# Patient Record
Sex: Female | Born: 1985 | Race: White | Hispanic: No | Marital: Single | Smoking: Current every day smoker
Health system: Southern US, Community
[De-identification: ages and names within clinical notes are randomized; demographics above are authoritative.]

## PROBLEM LIST (undated history)

## (undated) DIAGNOSIS — F32A Depression, unspecified: Secondary | ICD-10-CM

## (undated) DIAGNOSIS — K219 Gastro-esophageal reflux disease without esophagitis: Secondary | ICD-10-CM

## (undated) DIAGNOSIS — B977 Papillomavirus as the cause of diseases classified elsewhere: Secondary | ICD-10-CM

## (undated) DIAGNOSIS — R519 Headache, unspecified: Secondary | ICD-10-CM

## (undated) DIAGNOSIS — F329 Major depressive disorder, single episode, unspecified: Secondary | ICD-10-CM

## (undated) DIAGNOSIS — T7840XA Allergy, unspecified, initial encounter: Secondary | ICD-10-CM

## (undated) DIAGNOSIS — L732 Hidradenitis suppurativa: Secondary | ICD-10-CM

## (undated) DIAGNOSIS — G43909 Migraine, unspecified, not intractable, without status migrainosus: Secondary | ICD-10-CM

## (undated) DIAGNOSIS — R51 Headache: Secondary | ICD-10-CM

## (undated) HISTORY — DX: Headache: R51

## (undated) HISTORY — DX: Hidradenitis suppurativa: L73.2

## (undated) HISTORY — PX: COLPOSCOPY: SHX161

## (undated) HISTORY — DX: Major depressive disorder, single episode, unspecified: F32.9

## (undated) HISTORY — DX: Allergy, unspecified, initial encounter: T78.40XA

## (undated) HISTORY — DX: Depression, unspecified: F32.A

## (undated) HISTORY — DX: Migraine, unspecified, not intractable, without status migrainosus: G43.909

## (undated) HISTORY — DX: Gastro-esophageal reflux disease without esophagitis: K21.9

## (undated) HISTORY — DX: Headache, unspecified: R51.9

## (undated) HISTORY — DX: Papillomavirus as the cause of diseases classified elsewhere: B97.7

---

## 2005-03-22 ENCOUNTER — Other Ambulatory Visit: Admission: RE | Admit: 2005-03-22 | Discharge: 2005-03-22 | Payer: Self-pay | Admitting: Obstetrics and Gynecology

## 2005-10-12 ENCOUNTER — Inpatient Hospital Stay (HOSPITAL_COMMUNITY): Admission: AD | Admit: 2005-10-12 | Discharge: 2005-10-13 | Payer: Self-pay | Admitting: Obstetrics and Gynecology

## 2005-10-28 ENCOUNTER — Inpatient Hospital Stay (HOSPITAL_COMMUNITY): Admission: AD | Admit: 2005-10-28 | Discharge: 2005-10-28 | Payer: Self-pay | Admitting: Obstetrics and Gynecology

## 2005-10-29 ENCOUNTER — Inpatient Hospital Stay (HOSPITAL_COMMUNITY): Admission: AD | Admit: 2005-10-29 | Discharge: 2005-10-31 | Payer: Self-pay | Admitting: Obstetrics and Gynecology

## 2006-03-20 HISTORY — PX: CHOLECYSTECTOMY: SHX55

## 2006-10-06 ENCOUNTER — Emergency Department (HOSPITAL_COMMUNITY): Admission: EM | Admit: 2006-10-06 | Discharge: 2006-10-07 | Payer: Self-pay | Admitting: Emergency Medicine

## 2006-10-30 ENCOUNTER — Emergency Department (HOSPITAL_COMMUNITY): Admission: EM | Admit: 2006-10-30 | Discharge: 2006-10-30 | Payer: Self-pay | Admitting: Emergency Medicine

## 2006-11-05 ENCOUNTER — Ambulatory Visit (HOSPITAL_COMMUNITY): Admission: RE | Admit: 2006-11-05 | Discharge: 2006-11-06 | Payer: Self-pay | Admitting: General Surgery

## 2006-11-05 ENCOUNTER — Encounter (INDEPENDENT_AMBULATORY_CARE_PROVIDER_SITE_OTHER): Payer: Self-pay | Admitting: General Surgery

## 2008-06-14 ENCOUNTER — Emergency Department (HOSPITAL_COMMUNITY): Admission: EM | Admit: 2008-06-14 | Discharge: 2008-06-14 | Payer: Self-pay | Admitting: Emergency Medicine

## 2008-07-02 IMAGING — US US ABDOMEN COMPLETE
1 series · 14 of 25 positions shown · non-contrast
Comparison: none

CLINICAL DATA: 21-year-old female with abdominal  pain. 
ABDOMEN ULTRASOUND:
TECHNIQUE: Complete abdominal ultrasound examination was performed including evaluation of the liver, gallbladder, bile ducts, pancreas, kidneys, spleen, IVC, and abdominal aorta.

[Series 1: unknown · 0.40mm/px · 14 of 57 slices shown]
[im 1/57]
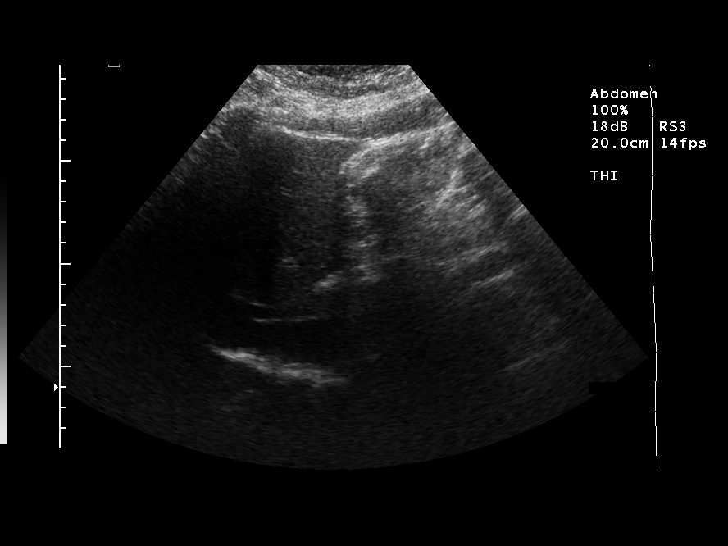
[im 5/57]
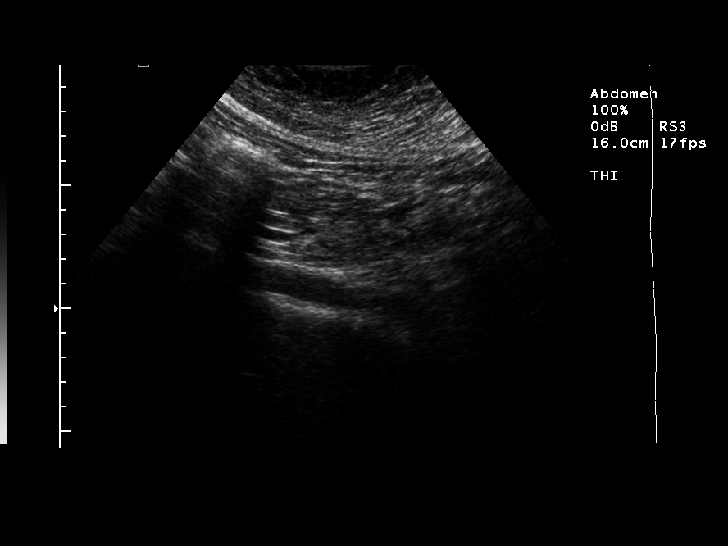
[im 10/57]
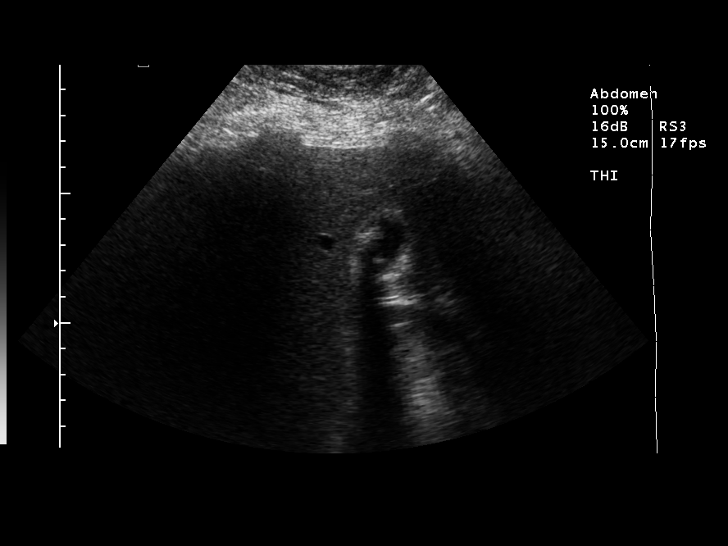
[im 15/57]
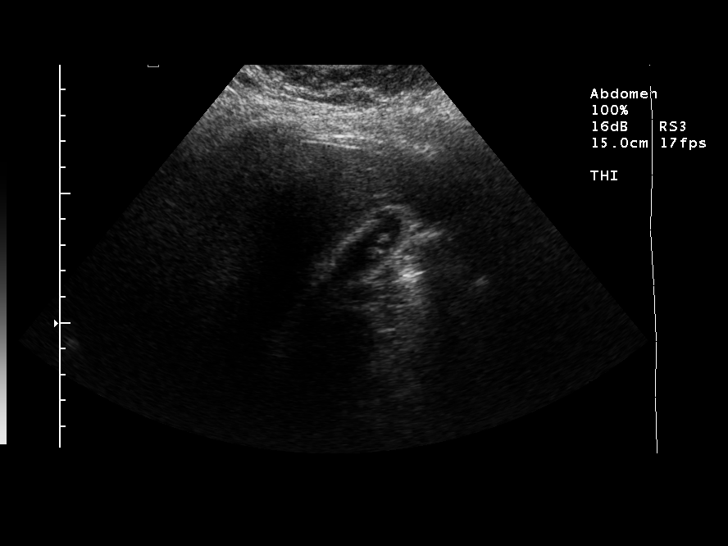
[im 19/57]
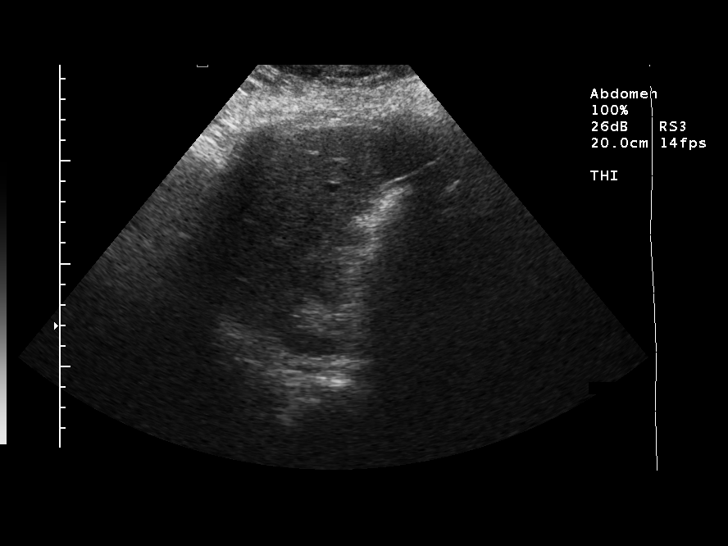
[im 22/57]
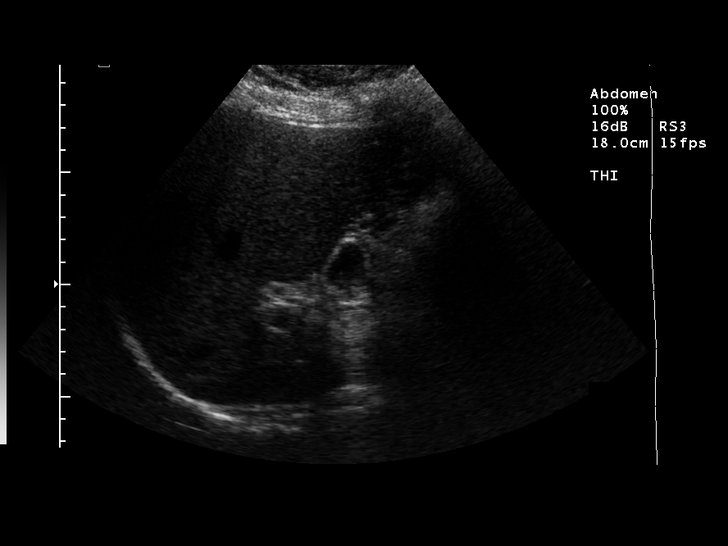
[im 26/57]
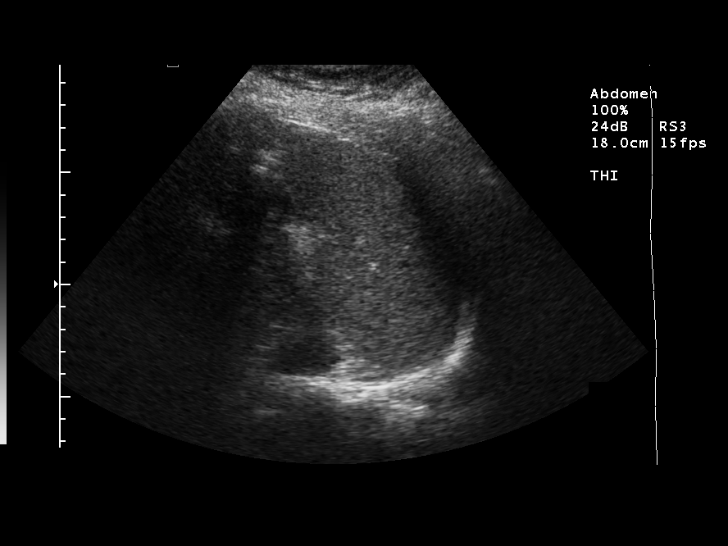
[im 31/57]
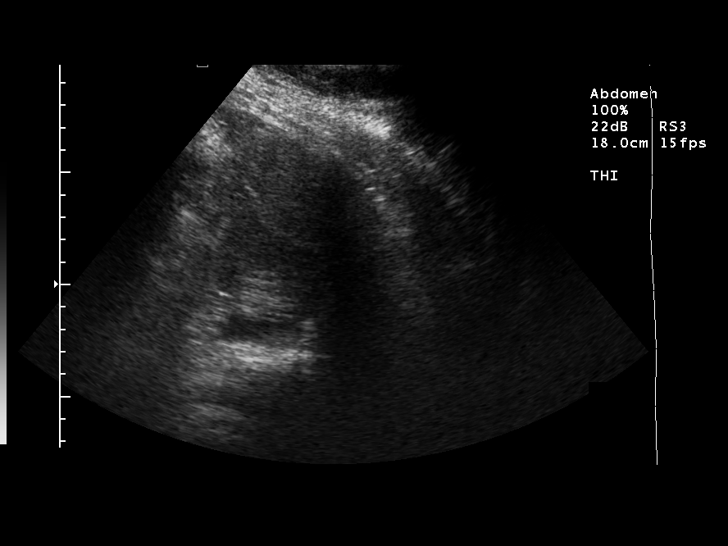
[im 36/57]
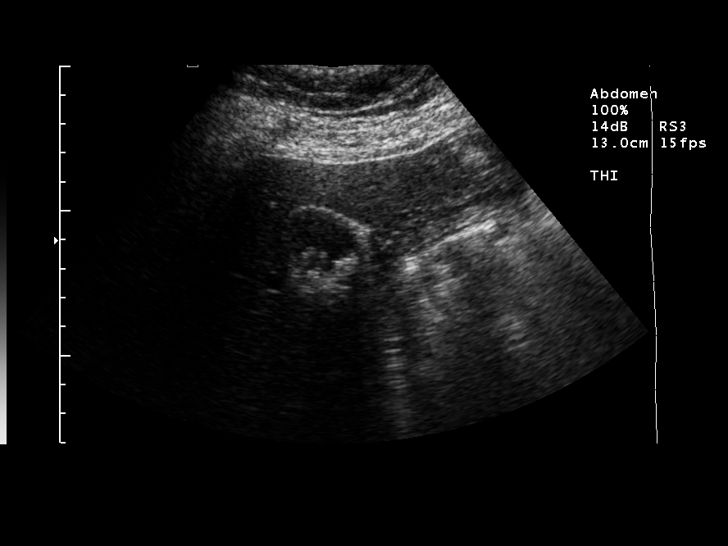
[im 38/57]
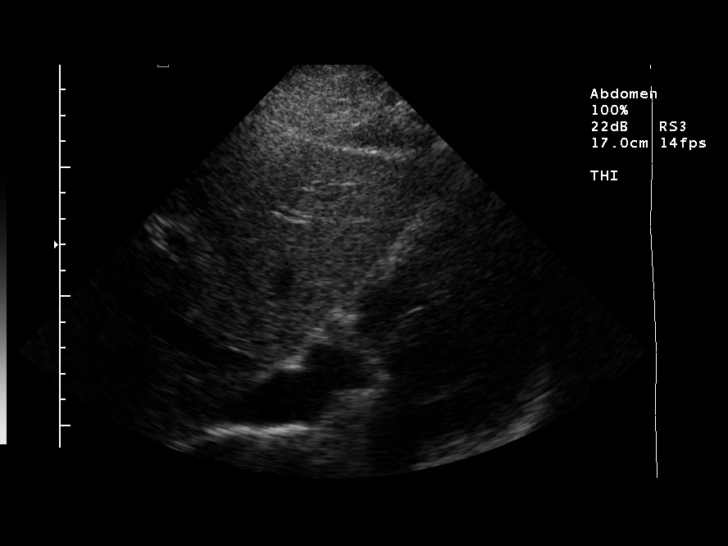
[im 43/57]
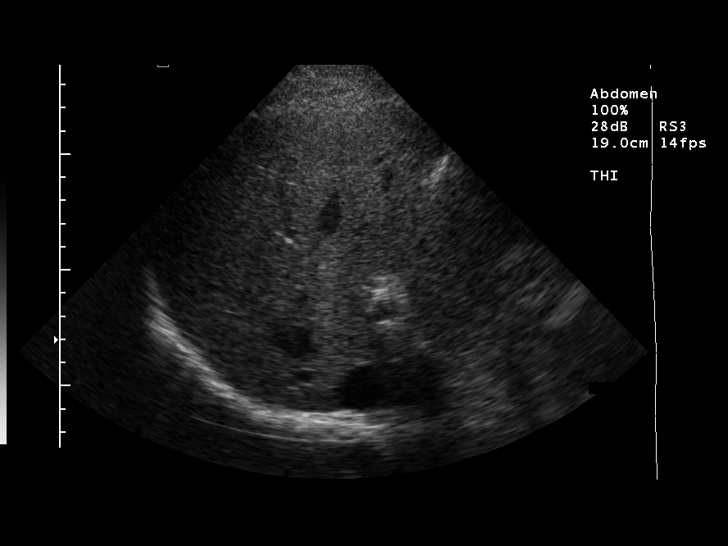
[im 47/57]
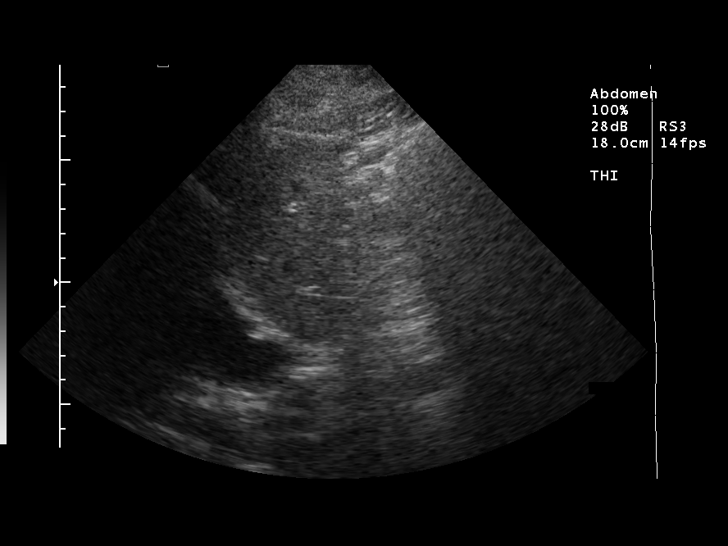
[im 52/57]
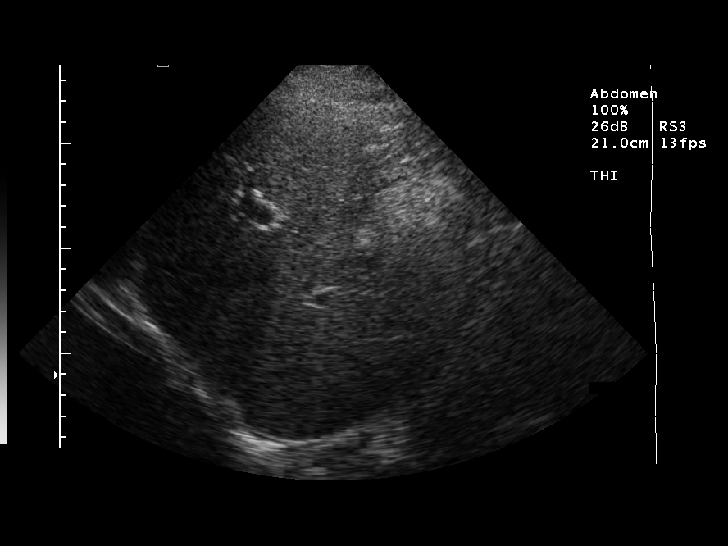
[im 57/57]
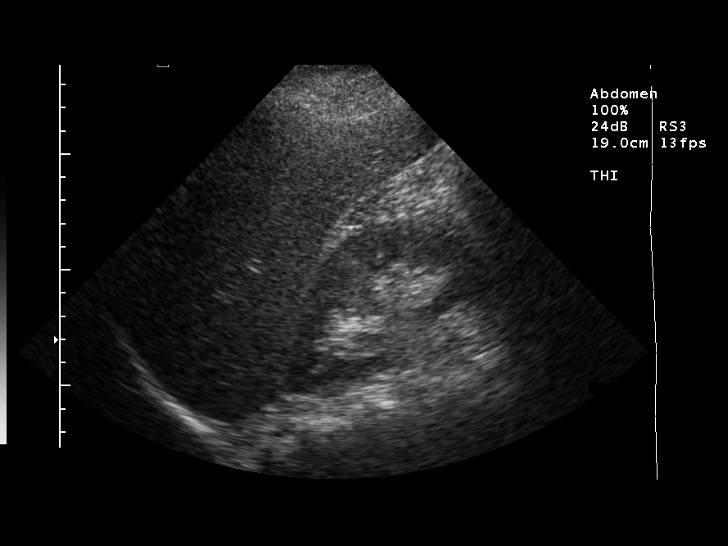

[14 of 25 positions shown; findings below may reference images not displayed]

FINDINGS: Cholelithiasis identified with upper limits gallbladder wall thickness - question cholecystitis. Diffuse fatty infiltration of the liver is identified. No evidence of free fluid. IVC, common bile duct, spleen, kidneys, and abdominal aorta are unremarkable. The pancreas is not well visualized secondary to overlying bowel gas.
IMPRESSION: 1.  Cholelithiasis with upper limits of normal gallbladder wall thickness. Cholecystitis not excluded. No biliary dilatation. 
2.  Fatty liver.
3.  Pancreas not well visualized.

## 2008-07-31 IMAGING — RF DG CHOLANGIOGRAM OPERATIVE
1 series · 4 of 4 positions shown · non-contrast
Comparison: none

CLINICAL DATA: History of gallstones.  
 OPERATIVE CHOLANGIOGRAM ? 11/05/06:
TECHNIQUE: Multiple fluoroscopic radiographs were obtained during intraoperative cholangiogram and are submitted for interpretation post-operatively.

[Series 1: run · 4 of 63 frames shown]
[frame 2/63]
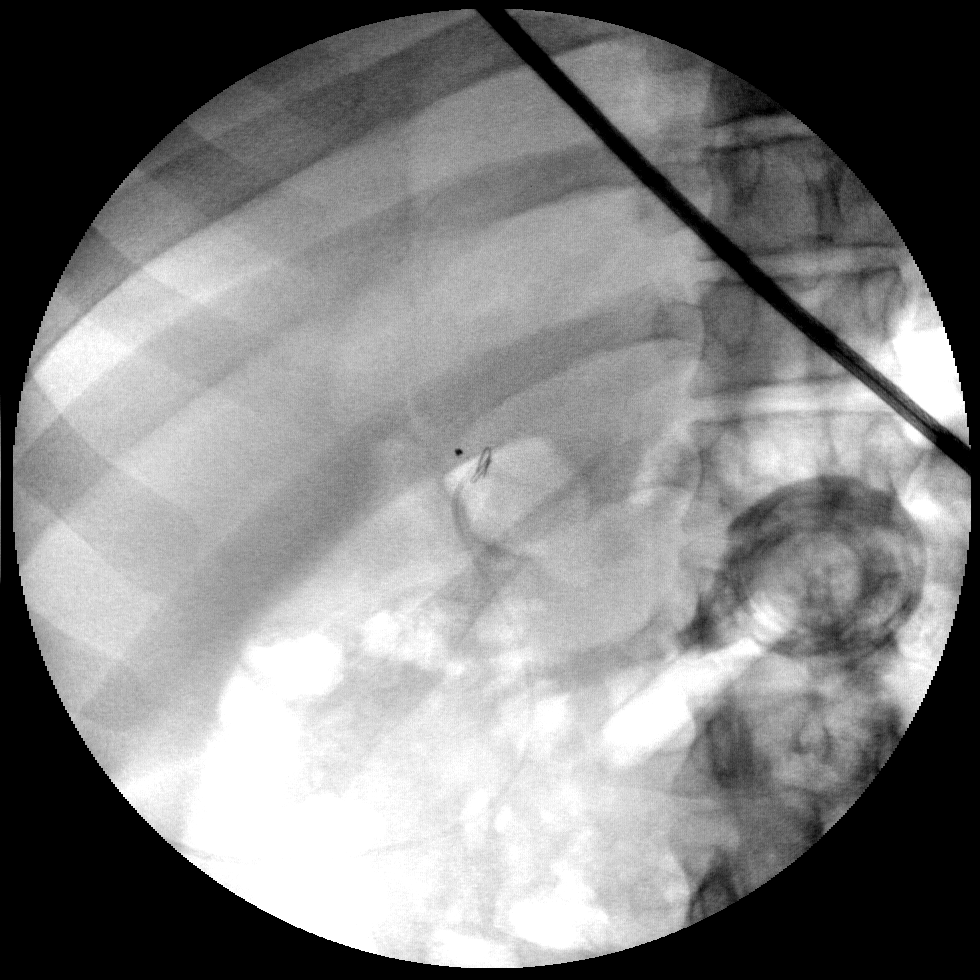
[frame 10/63]
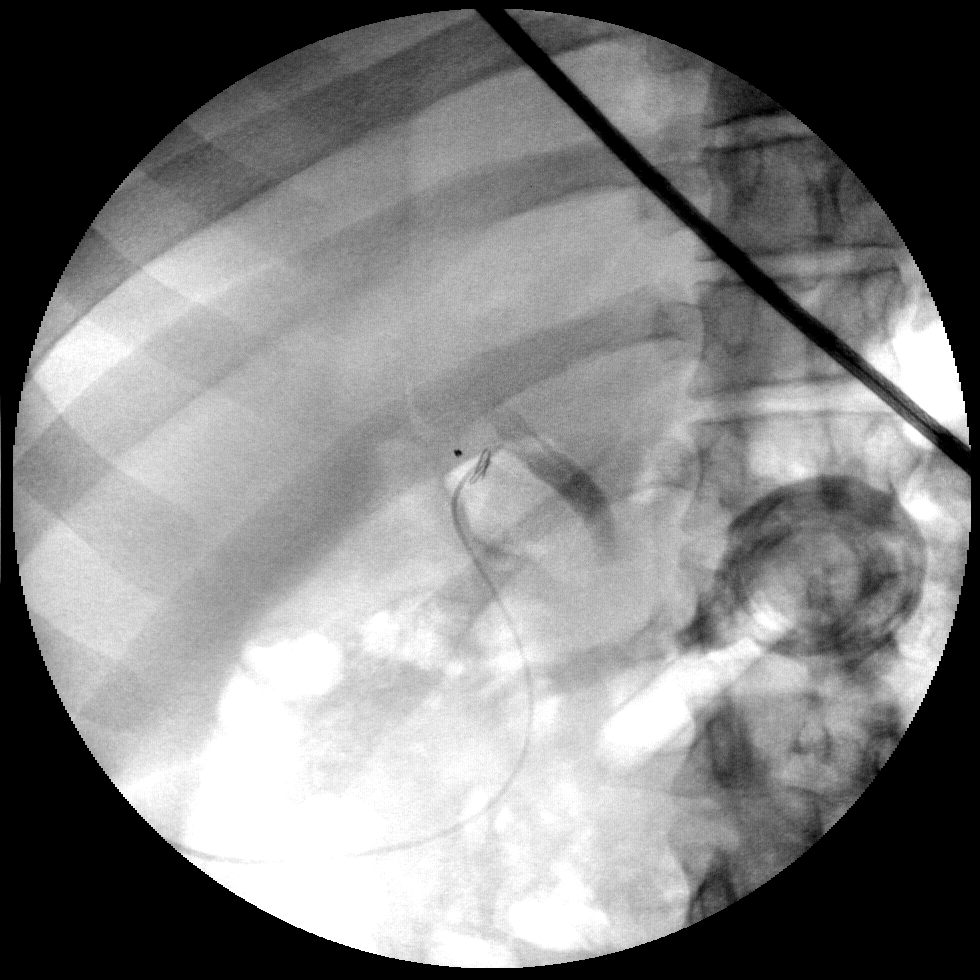
[frame 32/63]
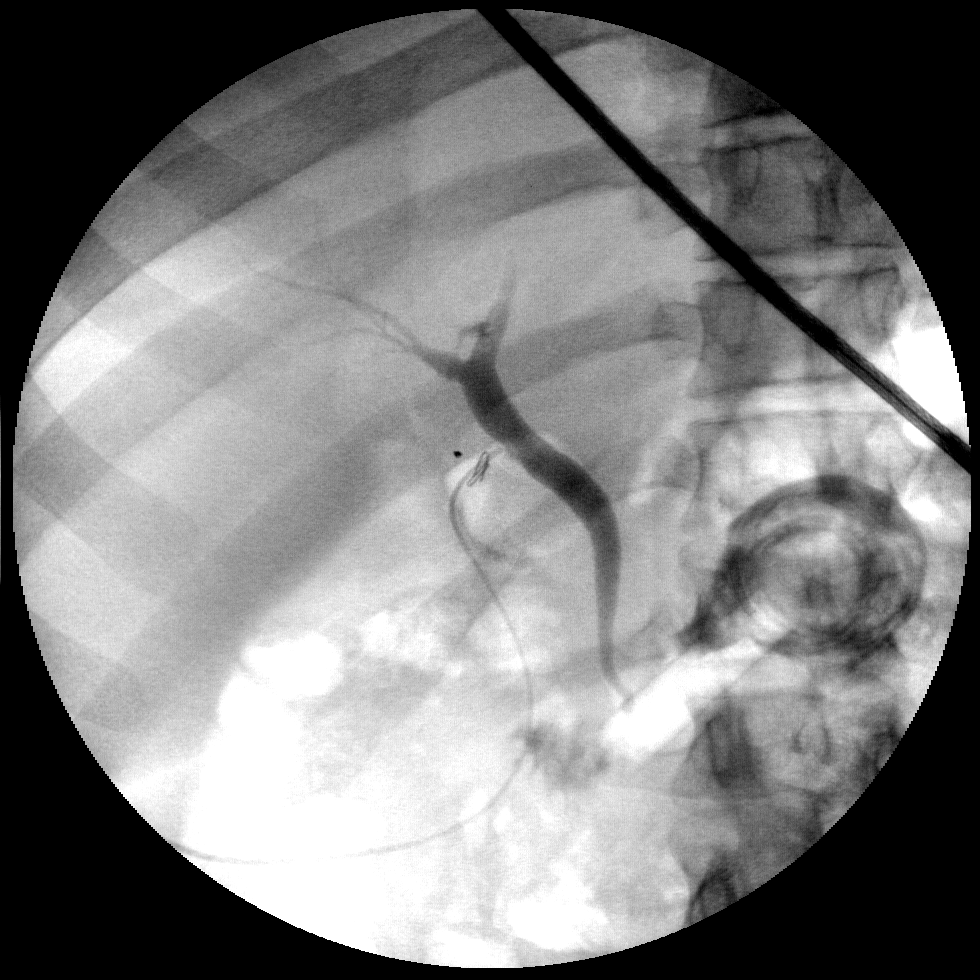
[frame 54/63]
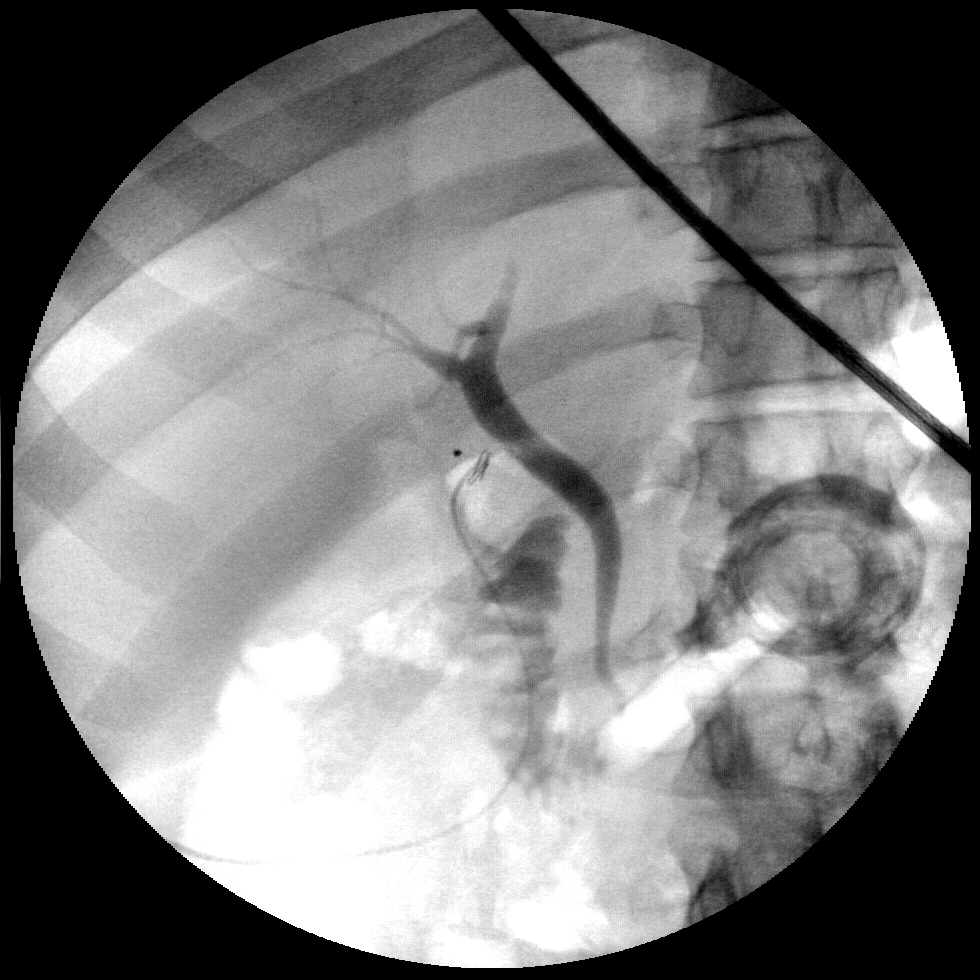

[4 of 4 positions shown; findings below may reference images not displayed]

FINDINGS: The common bile duct is well visualized without obstruction of the common bile duct.  No definite retained stones.
IMPRESSION: No obstruction to the common bile duct with contrast noted in the duodenum.

## 2010-06-30 LAB — URINE MICROSCOPIC-ADD ON

## 2010-06-30 LAB — URINALYSIS, ROUTINE W REFLEX MICROSCOPIC
Bilirubin Urine: NEGATIVE
Glucose, UA: NEGATIVE mg/dL
Ketones, ur: NEGATIVE mg/dL
Nitrite: POSITIVE — AB
Protein, ur: 30 mg/dL — AB
Specific Gravity, Urine: 1.024 (ref 1.005–1.030)
Urobilinogen, UA: 1 mg/dL (ref 0.0–1.0)
pH: 6.5 (ref 5.0–8.0)

## 2010-06-30 LAB — POCT PREGNANCY, URINE: Preg Test, Ur: NEGATIVE

## 2010-07-04 ENCOUNTER — Other Ambulatory Visit: Payer: Self-pay | Admitting: Obstetrics and Gynecology

## 2010-08-02 NOTE — Op Note (Signed)
Ann Obrien              ACCOUNT NO.:  [id number]   MEDICAL RECORD NO.:  [id number]          PATIENT TYPE:  OIB   LOCATION:  1539                         FACILITY:  [facility]   PHYSICIAN:  Anselm Pancoast. Weatherly, M.D.DATE OF BIRTH:  Oct 25, 1985   DATE OF PROCEDURE:  11/05/2006  DATE OF DISCHARGE:                               OPERATIVE REPORT   PREOPERATIVE DIAGNOSIS:  Chronic cholecystitis with stones.   POSTOPERATIVE DIAGNOSIS:  Chronic cholecystitis with stones.   OPERATIONS:  Laparoscopic cholecystectomy with cholangiogram.   ANESTHESIA:  General anesthesia.   SURGEON:  Anselm Pancoast. Zachery Dakins, M.D.   ASSISTANT:  Nurse.   HISTORY:  Ann Obrien is a 25 year old Caucasian female who is  approximately a year postpartum.  She had been seen in the emergency  room approximately 2 weeks ago.  She has actually had four episodes of  pain which she describes all similar pain that usually occurs after  eating. She had a couple episodes that were severe enough that she went  to the emergency room and she actually went back to the emergency room  approximately a week ago with a fifth episode.  She has had mildly  elevated liver function studies, SGOT and SGPT in approximately the 250  range and it showed a mildly thickened gallbladder but no definite  dilated common bile ducts. She weighs approximately 220 and is 5 feet 4  and is 1 year postpartum.  The patient preoperatively was given some  sedation this morning. Her liver function studies on Friday were normal  as was her white blood count.  She received 3 grams of Unasyn, she got  PAS stockings and was taken to the operative suite.  The abdomen was  prepped with Betadine surgical scrub.  After induction of general  anesthesia, an oral tube placed into the stomach.  A small incision was  made below the umbilicus, the fascia was identified about 2 inches down,  picked up between two Kochers and then a small opening made, I  thought,  into the peritoneal cavity.  I put a pursestring suture of #0 Vicryl and  then the Hasson cannula, but we were still kind of in the preperitoneal  space.  I grasped the fascia again and with a Tresa Endo could enter into the  peritoneal cavity and then reinserted this cannula. This time you could  see a little opening which we went through and then the upper 10 mm  trocar was placed in the subxiphoid area and the two lateral 5 mm  trocars were placed at the appropriate lateral position.  The  gallbladder was distended but not acutely inflamed and there was no  actual adhesions around it.  The proximal portion of the gallbladder was  carefully opened up. The cystic duct was freed, I could see also the  cystic artery and then with the peritoneal open pretty proximal on the  gallbladder, I could see the little Monday.  I placed a clip on the  junction of the cystic duct gallbladder and then made a little opening  proximal to this. A Cook catheter was introduced and  held in place with  a clip. The cholangiogram was obtained and it showed good prompt filling  of the extrahepatic biliary system, good flow into the duodenum and no  evidence of any common duct stones.  The catheter was removed, three  clips were placed on the cystic duct.  I could then identify the cystic  artery nicely and two clips proximally, one distally was placed and then  it was divided. The gallbladder was freed up using the hook  electrocautery.  I could see a little vessel I think is the  posterior  branch of the cystic artery and this was clipped and then divided. The  gallbladder was freed from its bed, the most distal portion was  intrahepatic and the gallbladder was free.  The irrigation was used, the  bed had good hemostasis and then the gallbladder was placed in an  EndoCatch bag.  The irrigation fluid was aspirated, we reinspected the  bed and everything looked satisfactory. We then slipped the camera   through the upper 10-mm trocar, withdrew the bag containing the  gallbladder at the umbilical cord. I then put a figure-of-eight suture  in the fascia at the umbilicus in addition to the pursestring suture  that had been previously placed.  Both were tied and then all total  about 30 mL of Marcaine with adrenaline was used.  The patient's CO2 was  released, there was no bleeding from the exit ports and then the  incisions were closed with 4-0 Vicryl.  Benzoin and Steri-Strips on the  skin. The patient tolerated the procedure nicely and was sent to the  recovery room in satisfactory condition.  She was complaining of nausea  when she first went to the recovery room.           ______________________________  Anselm Pancoast. Zachery Dakins, M.D.     WJW/MEDQ  D:  11/05/2006  T:  11/05/2006  Job:  [id number]

## 2010-12-30 LAB — AMYLASE: Amylase: 34

## 2010-12-30 LAB — PREGNANCY, URINE: Preg Test, Ur: NEGATIVE

## 2011-01-02 LAB — DIFFERENTIAL
Basophils Absolute: 0.1
Basophils Absolute: 0
Basophils Relative: 0
Eosinophils Absolute: 0.1
Eosinophils Relative: 1
Lymphocytes Relative: 26
Lymphocytes Relative: 18
Lymphs Abs: 1.9
Monocytes Absolute: 0.4
Monocytes Absolute: 0.6
Monocytes Relative: 6
Neutro Abs: 6.4
Neutro Abs: 7.7
Neutrophils Relative %: 74

## 2011-01-02 LAB — CBC
HCT: 43
Hemoglobin: 14.6
Hemoglobin: 14.6
MCHC: 34
MCV: 86.6
Platelets: 272
RBC: 4.97
RDW: 12.1
RDW: 12.3
WBC: 9.6
WBC: 10.4

## 2011-01-02 LAB — URINE MICROSCOPIC-ADD ON

## 2011-01-02 LAB — COMPREHENSIVE METABOLIC PANEL
ALT: 121 — ABNORMAL HIGH
AST: 211 — ABNORMAL HIGH
Albumin: 3.5
Albumin: 3.5
Alkaline Phosphatase: 103
BUN: 13
BUN: 18
CO2: 29
Calcium: 9.5
Calcium: 10.1
Chloride: 105
Creatinine, Ser: 0.82
Creatinine, Ser: 0.88
GFR calc Af Amer: >60
GFR calc non Af Amer: >60
Glucose, Bld: 137 — ABNORMAL HIGH
Potassium: 3.8
Sodium: 140
Total Bilirubin: 0.8
Total Protein: 6.7
Total Protein: 6.6

## 2011-01-02 LAB — URINALYSIS, ROUTINE W REFLEX MICROSCOPIC
Glucose, UA: NEGATIVE
Hgb urine dipstick: NEGATIVE
Nitrite: NEGATIVE
Specific Gravity, Urine: 1.02
Urobilinogen, UA: 0.2
pH: 6.5

## 2011-01-02 LAB — BASIC METABOLIC PANEL
Calcium: 9.4
GFR calc Af Amer: >60
GFR calc non Af Amer: >60
Glucose, Bld: 133 — ABNORMAL HIGH
Sodium: 138

## 2011-01-02 LAB — POCT PREGNANCY, URINE
Operator id: 133351
Preg Test, Ur: NEGATIVE

## 2011-01-02 LAB — PREGNANCY, URINE: Preg Test, Ur: NEGATIVE

## 2011-01-02 LAB — B-NATRIURETIC PEPTIDE (CONVERTED LAB): Pro B Natriuretic peptide (BNP): <30

## 2011-01-02 LAB — LIPASE, BLOOD: Lipase: 19

## 2015-01-05 ENCOUNTER — Other Ambulatory Visit (HOSPITAL_COMMUNITY)
Admission: RE | Admit: 2015-01-05 | Discharge: 2015-01-05 | Disposition: A | Discharge status: Home / Self Care | Payer: BLUE CROSS/BLUE SHIELD | Source: Ambulatory Visit | Attending: Unknown Physician Specialty | Admitting: Unknown Physician Specialty

## 2015-01-05 DIAGNOSIS — R87619 Unspecified abnormal cytological findings in specimens from cervix uteri: Secondary | ICD-10-CM | POA: Diagnosis not present

## 2015-01-07 LAB — CYTOLOGY - PAP

## 2015-01-11 ENCOUNTER — Encounter: Payer: Self-pay | Admitting: Family Medicine

## 2016-05-02 ENCOUNTER — Other Ambulatory Visit (HOSPITAL_COMMUNITY)
Admission: RE | Admit: 2016-05-02 | Discharge: 2016-05-02 | Disposition: A | Discharge status: Home / Self Care | Payer: BLUE CROSS/BLUE SHIELD | Source: Ambulatory Visit | Attending: Unknown Physician Specialty | Admitting: Unknown Physician Specialty

## 2016-05-02 DIAGNOSIS — R87810 Cervical high risk human papillomavirus (HPV) DNA test positive: Secondary | ICD-10-CM | POA: Insufficient documentation

## 2016-12-22 LAB — CBC AND DIFFERENTIAL
HEMATOCRIT: 44 (ref 36–46)
HEMOGLOBIN: 14.7 (ref 12.0–16.0)
WBC: 9.6

## 2016-12-22 LAB — HEPATIC FUNCTION PANEL
ALT: 12 (ref 7–35)
AST: 17 (ref 13–35)

## 2016-12-22 LAB — BASIC METABOLIC PANEL
Potassium: 4 (ref 3.4–5.3)
Sodium: 139 (ref 137–147)

## 2017-03-21 LAB — HM PAP SMEAR: HM Pap smear: NEGATIVE

## 2017-05-18 ENCOUNTER — Ambulatory Visit: Payer: BLUE CROSS/BLUE SHIELD | Admitting: Family Medicine

## 2017-05-25 ENCOUNTER — Encounter: Payer: Self-pay | Admitting: Family Medicine

## 2017-05-25 ENCOUNTER — Other Ambulatory Visit: Payer: Self-pay

## 2017-05-25 ENCOUNTER — Ambulatory Visit (INDEPENDENT_AMBULATORY_CARE_PROVIDER_SITE_OTHER): Payer: BLUE CROSS/BLUE SHIELD | Admitting: Family Medicine

## 2017-05-25 VITALS — BP 124/88 | HR 86 | Temp 98.9°F | Ht 63.0 in | Wt 236.0 lb

## 2017-05-25 DIAGNOSIS — Z3040 Encounter for surveillance of contraceptives, unspecified: Secondary | ICD-10-CM | POA: Insufficient documentation

## 2017-05-25 DIAGNOSIS — L732 Hidradenitis suppurativa: Secondary | ICD-10-CM | POA: Insufficient documentation

## 2017-05-25 DIAGNOSIS — R197 Diarrhea, unspecified: Secondary | ICD-10-CM | POA: Diagnosis not present

## 2017-05-25 DIAGNOSIS — G43009 Migraine without aura, not intractable, without status migrainosus: Secondary | ICD-10-CM | POA: Diagnosis not present

## 2017-05-25 DIAGNOSIS — J301 Allergic rhinitis due to pollen: Secondary | ICD-10-CM | POA: Insufficient documentation

## 2017-05-25 DIAGNOSIS — F17210 Nicotine dependence, cigarettes, uncomplicated: Secondary | ICD-10-CM | POA: Diagnosis not present

## 2017-05-25 LAB — TSH: TSH: 4.05 u[IU]/mL (ref 0.35–4.50)

## 2017-05-25 LAB — CBC WITH DIFFERENTIAL/PLATELET
Basophils Absolute: 0 10*3/uL (ref 0.0–0.1)
Basophils Relative: 0.5 % (ref 0.0–3.0)
EOS PCT: 2.8 % (ref 0.0–5.0)
Eosinophils Absolute: 0.3 10*3/uL (ref 0.0–0.7)
HEMATOCRIT: 43.8 % (ref 36.0–46.0)
Hemoglobin: 14.8 g/dL (ref 12.0–15.0)
LYMPHS PCT: 30.2 % (ref 12.0–46.0)
Lymphs Abs: 2.8 10*3/uL (ref 0.7–4.0)
MCHC: 33.8 g/dL (ref 30.0–36.0)
MCV: 91.7 fl (ref 78.0–100.0)
MONOS PCT: 4.7 % (ref 3.0–12.0)
Monocytes Absolute: 0.4 10*3/uL (ref 0.1–1.0)
Neutro Abs: 5.8 10*3/uL (ref 1.4–7.7)
Neutrophils Relative %: 61.8 % (ref 43.0–77.0)
Platelets: 286 10*3/uL (ref 150.0–400.0)
RBC: 4.78 Mil/uL (ref 3.87–5.11)
RDW: 12.8 % (ref 11.5–15.5)
WBC: 9.4 10*3/uL (ref 4.0–10.5)

## 2017-05-25 LAB — COMPREHENSIVE METABOLIC PANEL
ALBUMIN: 4.1 g/dL (ref 3.5–5.2)
ALK PHOS: 87 U/L (ref 39–117)
ALT: 19 U/L (ref 0–35)
AST: 11 U/L (ref 0–37)
BILIRUBIN TOTAL: 0.4 mg/dL (ref 0.2–1.2)
BUN: 16 mg/dL (ref 6–23)
CO2: 25 mEq/L (ref 19–32)
Calcium: 9.6 mg/dL (ref 8.4–10.5)
Chloride: 107 mEq/L (ref 96–112)
Creatinine, Ser: 0.93 mg/dL (ref 0.40–1.20)
GFR: 73.82 mL/min (ref >60.00)
Glucose, Bld: 98 mg/dL (ref 70–99)
Potassium: 4 mEq/L (ref 3.5–5.1)
Sodium: 141 mEq/L (ref 135–145)
TOTAL PROTEIN: 7 g/dL (ref 6.0–8.3)

## 2017-05-25 LAB — SEDIMENTATION RATE: SED RATE: 6 mm/h (ref 0–20)

## 2017-05-25 NOTE — Patient Instructions (Signed)
Please return in 2 weeks to go over results, recheck.  It was a pleasure meeting you today! Thank you for choosing [place] to meet your healthcare needs! I truly look forward to working with you. If you have any questions or concerns, please send me a message via Mychart or call the office at [phone].   Calcium Intake Recommendations You can take Caltrate Plus twice a day or get it through your diet or other OTC supplements (Viactiv, OsCal etc)  Calcium is a mineral that affects many functions in the body, including:  Blood clotting.  Blood vessel function.  Nerve impulse conduction.  Hormone secretion.  Muscle contraction.  Bone and teeth functions.  Most of your body's calcium supply is stored in your bones and teeth. When your calcium stores are low, you may be at risk for low bone mass, bone loss, and bone fractures. Consuming enough calcium helps to grow healthy bones and teeth and to prevent breakdown over time. It is very important that you get enough calcium if you are:  A child undergoing rapid growth.  An adolescent girl.  A pre- or post-menopausal woman.  A woman whose menstrual cycle has stopped due to anorexia nervosa or regular intense exercise.  An individual with lactose intolerance or a milk allergy.  A vegetarian.  What is my plan? Try to consume the recommended amount of calcium daily based on your age. Depending on your overall health, your health care provider may recommend increased calcium intake.General daily calcium intake recommendations by age are:  Birth to 6 months: 200 mg.  Infants 7 to 12 months: 260 mg.  Children 1 to 3 years: 700 mg.  Children 4 to 8 years: 1,000 mg.  Children 9 to 13 years: 1,300 mg.  Teens 14 to 18 years: 1,300 mg.  Adults 19 to 50 years: 1,000 mg.  Adult women 51 to 70 years: 1,200 mg.  Adult men 51 to 70 years: 1,000 mg.  Adults 71 years and older: 1,200 mg.  Pregnant and breastfeeding teens: 1,300  mg.  Pregnant and breastfeeding adults: 1,000 mg.  What do I need to know about calcium intake?  In order for the body to absorb calcium, it needs vitamin D. You can get vitamin D through (we recommend getting [phone] units of Vitamin D daily) ? Direct exposure of the skin to sunlight. ? Foods, such as egg yolks, liver, saltwater fish, and fortified milk. ? Supplements.  Consuming too much calcium may cause: ? Constipation. ? Decreased absorption of iron and zinc. ? Kidney stones.  Calcium supplements may interact with certain medicines. Check with your health care provider before starting any calcium supplements.  Try to get most of your calcium from food. What foods can I eat? Grains  Fortified oatmeal. Fortified ready-to-eat cereals. Fortified frozen waffles. Vegetables Turnip greens. Broccoli. Fruits Fortified orange juice. Meats and Other Protein Sources Canned sardines with bones. Canned salmon with bones. Soy beans. Tofu. Baked beans. Almonds. [place] nuts. Sunflower seeds. Dairy Milk. Yogurt. Cheese. Cottage cheese. Beverages Fortified soy milk. Fortified rice milk. Sweets/Desserts Pudding. Ice Cream. Milkshakes. Blackstrap molasses. The items listed above may not be a complete list of recommended foods or beverages. Contact your dietitian for more options. What foods can affect my calcium intake? It may be more difficult for your body to use calcium or calcium may leave your body more quickly if you consume large amounts of:  Sodium.  Protein.  Caffeine.  Alcohol.  This information is not intended  to replace advice given to you by your health care provider. Make sure you discuss any questions you have with your health care provider. Document Released: 10/19/2003 Document Revised: 09/24/2015 Document Reviewed: 08/12/2013 Elsevier Interactive Patient Education  2018 [employer].  Lactose Intolerance, Adult Lactose is the natural sugar found in milk and  milk products, such as cheese and yogurt. Lactose is digested by lactase, an enzyme in your small intestine. Some people do not produce enough lactase to digest lactose. This is called lactose intolerance. Lactose intolerance is different from milk allergy, which is a more serious reaction to the protein in milk. What are the causes? Causes of lactose intolerance may include:  Normal aging. The ability to produce lactase may decline with age, causing lactose intolerance over time.  Being born without the ability to make lactase.  Digestive diseases such as gastroenteritis or inflammatory bowel disease.  Surgery or injuries to your small intestine.  Infection in your intestines.  Certain antibiotic medicines and cancer treatments.  What are the signs or symptoms? Lactose intolerance can cause uncomfortable symptoms. These are likely to occur within 30 minutes to 2 hours after eating or drinking foods containing lactose. Symptoms of lactose intolerance may include:  Nausea.  Diarrhea.  Abdominal cramps or pain.  Bloating.  Gas.  How is this diagnosed? There are several tests your health care provider can do to diagnose lactose intolerance. These tests include a hydrogen breath test and stool acidity test. How is this treated? No treatment can improve your body's ability to produce lactase. However, your symptoms can be controlled by limiting or avoiding milk products and other sources of lactose and adjusting your diet. Lactose-free milk is often tolerated. Lactose digestion may also be improved by adding lactase drops to regular milk or by taking lactase tablets when dairy products are consumed. Tolerance to lactose is individual. Some people may be able to eat or drink small amounts of products with lactose, while other may need to avoid lactose entirely. Talk to your health care provider about what is best for you. Follow these instructions at home:  Limit or avoidfoods,  beverages, and medicines containing lactose as directed by your health care provider.  Read food and medicine labels carefully to avoid products containing lactose, milk solids, casein, or whey.  If you eliminate dairy products, replace the protein, calcium, vitamin D, and other nutrients they contain through other foods. A registered dietitian or your health care provider can help you adjust your diet.  Choose a milk substitute that is fortified with calcium and vitamin D. Be aware that soy milk contains high quality protein, while milks made from nuts or grains contain very little protein.  Use lactase drops or tablets if directed by your health care provider. Contact a health care provider if: You have no relief from your symptoms after eliminating milk products and other sources of lactose. This information is not intended to replace advice given to you by your health care provider. Make sure you discuss any questions you have with your health care provider. Document Released: 03/06/2005 Document Revised: 08/12/2015 Document Reviewed: 06/06/2013 Elsevier Interactive Patient Education  2018 [employer].

## 2017-05-25 NOTE — Progress Notes (Signed)
Subjective  CC:  Chief Complaint  Patient presents with  . Establish Care  . Diarrhea    Patient states that since Thanksgiving, she has been sick with Nausea, Vomitting and Diarrhea, 1-2 times per week     HPI: Ann Obrien is a 32 y.o. female who presents to [place] [facility] today to establish care with me as a new patient.  She has the following concerns or needs:   32 year old female, mother of an 77 year old son, and monogamous relationship, presents as new patient.  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  He has not had primary care doctor before.  Complains of diarrhea: She reports that since the birth of her child she had frequent episodes of diarrhea after meals.  This tended to get better over time.  She had her gallbladder out 2 years ago.  After that she had regular bowel movements.  Then about 3-4 months ago, complained of intermittent but weekly episodes of nausea vomiting and diarrhea.  She reports multiple bouts of diarrhea, loose watery stools without mucus or blood.  She denies pain but did have abdominal cramping.  She believes that she could be lactose intolerant.  Over the last 3 weeks she has been eaten lactose and gluten-free.  She is no longer having diarrhea.  She has had mild constipation over the last 48 hours and this is uncomfortable for her.  Her mother has irritable bowel syndrome.  No recent travel out of the country.  No family history of inflammatory bowel disease.  She denies fevers, chills, abdominal pain, melena, unwanted weight loss.  She is on Depo-Provera for contraception.  We updated and reviewed the patient's past history in detail and it is documented below.  Patient Active Problem List   Diagnosis Date Noted  . Cigarette nicotine dependence 05/25/2017  . Chronic allergic rhinitis due to pollen 05/25/2017  . Migraine headache without aura 05/25/2017  . Contraceptive use - depo-provera 05/25/2017  .  Hidradenitis suppurativa 05/25/2017   Health Maintenance  Topic Date Due  . HIV Screening  06/17/1999  . INFLUENZA VACCINE  01/25/2018 (Originally 10/18/2016)  . PAP SMEAR  01/04/2018  . TETANUS/TDAP  06/18/2021   Immunization History  Administered Date(s) Administered  . Tdap 06/19/2011   Current Meds  Medication Sig  . Aspirin-Acetaminophen-Caffeine (GOODY HEADACHE PO) Take by mouth.  . fluticasone (FLONASE) 50 MCG/ACT nasal spray Place into both nostrils daily.  . Loratadine (CLARITIN PO) Take by mouth.    Allergies: Patient is allergic to zithromax [azithromycin]; oxycodone; and tylenol 8 hour [acetaminophen]. Past Medical History Patient  has a past medical history of Allergy, Depression, Frequent headaches, GERD (gastroesophageal reflux disease), and Migraines. Past Surgical History Patient  has a past surgical history that includes Cholecystectomy (2008). Family History: Patient family history includes Asthma in her son; Irritable bowel syndrome in her mother. Social History:  Patient  reports that she has been smoking cigarettes.  She has a 20.00 pack-year smoking history. she has never used smokeless tobacco. She reports that she drinks alcohol. She reports that she does not use drugs.  Review of Systems: Constitutional: negative for fever or malaise Ophthalmic: negative for photophobia, double vision or loss of vision Cardiovascular: negative for chest pain, dyspnea on exertion, or new LE swelling Respiratory: negative for SOB or persistent cough Gastrointestinal: negative for abdominal pain, positive for change in bowel habits or melena Genitourinary: negative for dysuria or gross hematuria Musculoskeletal: negative for new gait  disturbance or muscular weakness Integumentary: negative for new or persistent rashes Neurological: negative for TIA or stroke symptoms Psychiatric: negative for SI or delusions Allergic/Immunologic: negative for hives  Patient Care Team      Relationship Specialty Notifications Start End  Leamon Arnt, MD PCP - General Family Medicine  05/25/17     Objective  Vitals: BP 124/88   Pulse 86   Temp 98.9 F (37.2 C)   Ht 5\' 3"  (1.6 m)   Wt 236 lb (107 kg)   BMI 41.81 kg/m  General:  Well developed, well nourished, no acute distress  Psych:  Alert and oriented,normal mood and affect HEENT:  Normocephalic, atraumatic, non-icteric sclera, PERRL, oropharynx is without mass or exudate, supple neck without adenopathy, mass or thyromegaly Cardiovascular:  RRR without gallop, rub or murmur, nondisplaced PMI Respiratory:  Good breath sounds bilaterally, CTAB with normal respiratory effort Gastrointestinal: normal bowel sounds, soft, minimally diffuse tenderness without rebound or guarding, no noted masses. No HSM MSK: no deformities, contusions. Joints are without erythema or swelling Skin:  Warm, no rashes or suspicious lesions noted Neurologic:    Mental status is normal. Gross motor and sensory exams are normal. Normal gait  Assessment  1. Diarrhea, unspecified type   2. Cigarette nicotine dependence without complication   3. Migraine without aura and without status migrainosus, not intractable   4. Hidradenitis suppurativa      Plan   Intermittent diarrhea, most consistent with food intolerance.  Likely has lactose intolerance.  Could also have celiac disease.  Blood work done today.  Recommend low lactose diet.  Recommend calcium and vitamin D due to chronic Depo-Provera.  Recheck in 2 weeks.  If symptoms persist, consider GI referral to rule out inflammatory bowel disease or other.  She could also have IBS.  Consider antispasmodics.  Smoking: Patient is cutting back on her own.  Continue to counsel.  Intermittent migraine headaches.  Cautious use of Goody powders.  Follow up: 2-week follow-up for recheck and result review  Commons side effects, risks, benefits, and alternatives for medications and treatment plan  prescribed today were discussed, and the patient expressed understanding of the given instructions. Patient is instructed to call or message via MyChart if he/she has any questions or concerns regarding our treatment plan. No barriers to understanding were identified. We discussed Red Flag symptoms and signs in detail. Patient expressed understanding regarding what to do in case of urgent or emergency type symptoms.   Medication list was reconciled, printed and provided to the patient in AVS. Patient instructions and summary information was reviewed with the patient as documented in the AVS. This note was prepared with assistance of Dragon voice recognition software. Occasional wrong-word or sound-a-like substitutions may have occurred due to the inherent limitations of voice recognition software  Orders Placed This Encounter  Procedures  . TSH  . CBC with Differential/Platelet  . Comprehensive metabolic panel  . Sedimentation rate  . Gliadin IgA+tTG IgA   No orders of the defined types were placed in this encounter.

## 2017-05-28 LAB — GLIADIN IGA+TTG IGA
Antigliadin Abs, IgA: 18 units (ref 0–19)
Transglutaminase IgA: <2 U/mL (ref 0–3)

## 2017-06-08 ENCOUNTER — Encounter: Payer: Self-pay | Admitting: Family Medicine

## 2017-06-08 ENCOUNTER — Ambulatory Visit (INDEPENDENT_AMBULATORY_CARE_PROVIDER_SITE_OTHER): Payer: BLUE CROSS/BLUE SHIELD | Admitting: Family Medicine

## 2017-06-08 ENCOUNTER — Other Ambulatory Visit: Payer: Self-pay

## 2017-06-08 VITALS — BP 120/74 | HR 93 | Temp 98.4°F | Ht 63.0 in | Wt 238.0 lb

## 2017-06-08 DIAGNOSIS — E739 Lactose intolerance, unspecified: Secondary | ICD-10-CM

## 2017-06-08 NOTE — Progress Notes (Signed)
Subjective  CC:  Chief Complaint  Patient presents with  . Follow-up    Patients states she is having trouble going to the bathroom over a 24 hr span, per patient "not normal for me" , Discuss Results     HPI: Ann Obrien is a 32 y.o. female who presents to the office today to address the problems listed above in the chief complaint.  32 year old here for follow-up of diarrhea.  At her last visit she had extensive history of diarrhea and abdominal cramping.  She stopped drinking milk and the symptoms have completely resolved.  She is also started calcium supplementation.  Now having symptoms of constipation.  She is completely frustrated.  She is confused.  Stool is now firm and intermittent.  Her normal is to soft bowel movements daily.  We reviewed her blood work which was all normal.  See below  Complains of intermittent headaches, taking Goody powders weekly. I reviewed the patients updated PMH, FH, and SocHx.    Patient Active Problem List   Diagnosis Date Noted  . Cigarette nicotine dependence 05/25/2017  . Chronic allergic rhinitis due to pollen 05/25/2017  . Migraine headache without aura 05/25/2017  . Contraceptive use - depo-provera 05/25/2017  . Hidradenitis suppurativa 05/25/2017   Current Meds  Medication Sig  . fluticasone (FLONASE) 50 MCG/ACT nasal spray Place into both nostrils daily.  . Loratadine (CLARITIN PO) Take by mouth.  . [DISCONTINUED] Aspirin-Acetaminophen-Caffeine (GOODY HEADACHE PO) Take by mouth.    Allergies: Patient is allergic to zithromax [azithromycin]; oxycodone; and tylenol 8 hour [acetaminophen]. Family History: Patient family history includes Alcohol abuse in her maternal grandfather and paternal grandfather; Arthritis in her mother, paternal grandfather, and paternal grandmother; Asthma in her paternal grandmother and son; Birth defects in her paternal grandmother; COPD in her maternal grandmother and paternal grandfather; Cancer in her  father and maternal grandmother; Diabetes in her maternal grandmother; Early death in her maternal grandfather, paternal grandfather, and paternal grandmother; Hearing loss in her maternal grandfather and paternal grandfather; Heart disease in her maternal grandfather and paternal grandfather; Hyperlipidemia in her maternal grandmother and mother; Hypertension in her father and maternal grandmother; Irritable bowel syndrome in her mother; Kidney disease in her maternal grandfather; Stroke in her maternal grandmother. Social History:  Patient  reports that she has been smoking cigarettes.  She has a 20.00 pack-year smoking history. She has never used smokeless tobacco. She reports that she drinks alcohol. She reports that she does not use drugs.  Review of Systems: Constitutional: Negative for fever malaise or anorexia Cardiovascular: negative for chest pain Respiratory: negative for SOB or persistent cough Gastrointestinal: negative for abdominal pain  Objective  Vitals: BP 120/74   Pulse 93   Temp 98.4 F (36.9 C)   Ht 5\' 3"  (1.6 m)   Wt 238 lb (108 kg)   BMI 42.16 kg/m  General: no acute distress , A&Ox3 HEENT: PEERL, conjunctiva normal, Oropharynx moist,neck is supple Cardiovascular:  RRR without murmur or gallop.  Respiratory:  Good breath sounds bilaterally, CTAB with normal respiratory effort Skin:  Warm, no rashes  Assessment  1. Lactose intolerance      Plan   Lactose intolerance: We had long discussion regarding how changing her diet over 2 weeks resolved her diarrhea.  This is strong clinical evidence for lactose intolerance.  Recommend continuing to refrain from milk.  Stop calcium to help with her constipation.  Follow-up with me if symptoms do not improve  Headaches, likely multifactorial.  Educated about rebound headaches and overuse of medications.   Commons side effects, risks, benefits, and alternatives for medications and treatment plan prescribed today were  discussed, and the patient expressed understanding of the given instructions. Patient is instructed to call or message via MyChart if he/she has any questions or concerns regarding our treatment plan. No barriers to understanding were identified. We discussed Red Flag symptoms and signs in detail. Patient expressed understanding regarding what to do in case of urgent or emergency type symptoms.   Medication list was reconciled, printed and provided to the patient in AVS. Patient instructions and summary information was reviewed with the patient as documented in the AVS. This note was prepared with assistance of Dragon voice recognition software. Occasional wrong-word or sound-a-like substitutions may have occurred due to the inherent limitations of voice recognition software  No orders of the defined types were placed in this encounter.  No orders of the defined types were placed in this encounter.

## 2017-06-08 NOTE — Patient Instructions (Signed)
Please return as needed.  If you have any questions or concerns, please don't hesitate to send me a message via MyChart or call the office at [phone]. Thank you for visiting with [place] today! It's our pleasure caring for you.  Lactose Intolerance, Adult Lactose is the natural sugar found in milk and milk products, such as cheese and yogurt. Lactose is digested by lactase, an enzyme in your small intestine. Some people do not produce enough lactase to digest lactose. This is called lactose intolerance. Lactose intolerance is different from milk allergy, which is a more serious reaction to the protein in milk. What are the causes? Causes of lactose intolerance may include:  Normal aging. The ability to produce lactase may decline with age, causing lactose intolerance over time.  Being born without the ability to make lactase.  Digestive diseases such as gastroenteritis or inflammatory bowel disease.  Surgery or injuries to your small intestine.  Infection in your intestines.  Certain antibiotic medicines and cancer treatments.  What are the signs or symptoms? Lactose intolerance can cause uncomfortable symptoms. These are likely to occur within 30 minutes to 2 hours after eating or drinking foods containing lactose. Symptoms of lactose intolerance may include:  Nausea.  Diarrhea.  Abdominal cramps or pain.  Bloating.  Gas.  How is this diagnosed? There are several tests your health care provider can do to diagnose lactose intolerance. These tests include a hydrogen breath test and stool acidity test. How is this treated? No treatment can improve your body's ability to produce lactase. However, your symptoms can be controlled by limiting or avoiding milk products and other sources of lactose and adjusting your diet. Lactose-free milk is often tolerated. Lactose digestion may also be improved by adding lactase drops to regular milk or by taking lactase tablets when dairy products  are consumed. Tolerance to lactose is individual. Some people may be able to eat or drink small amounts of products with lactose, while other may need to avoid lactose entirely. Talk to your health care provider about what is best for you. Follow these instructions at home:  Limit or avoidfoods, beverages, and medicines containing lactose as directed by your health care provider.  Read food and medicine labels carefully to avoid products containing lactose, milk solids, casein, or whey.  If you eliminate dairy products, replace the protein, calcium, vitamin D, and other nutrients they contain through other foods. A registered dietitian or your health care provider can help you adjust your diet.  Choose a milk substitute that is fortified with calcium and vitamin D. Be aware that soy milk contains high quality protein, while milks made from nuts or grains contain very little protein.  Use lactase drops or tablets if directed by your health care provider. Contact a health care provider if: You have no relief from your symptoms after eliminating milk products and other sources of lactose. This information is not intended to replace advice given to you by your health care provider. Make sure you discuss any questions you have with your health care provider. Document Released: 03/06/2005 Document Revised: 08/12/2015 Document Reviewed: 06/06/2013 Elsevier Interactive Patient Education  2018 [employer].

## 2017-06-21 ENCOUNTER — Encounter: Payer: Self-pay | Admitting: Emergency Medicine

## 2017-07-03 ENCOUNTER — Telehealth: Payer: Self-pay | Admitting: Emergency Medicine

## 2017-07-03 ENCOUNTER — Encounter: Payer: Self-pay | Admitting: Nurse Practitioner

## 2017-07-03 DIAGNOSIS — R1084 Generalized abdominal pain: Secondary | ICD-10-CM

## 2017-07-03 NOTE — Telephone Encounter (Signed)
Referred to [facility].

## 2017-07-03 NOTE — Telephone Encounter (Signed)
Yes!! Please refer.

## 2017-07-03 NOTE — Telephone Encounter (Signed)
Okay to refer??   Copied from [place] [phone]. Topic: Referral - Request >> Jul 03, 2017 12:45 PM Hewitt Shorts wrote: CRM for notification. See Telephone encounter for: 07/03/17.pt is calling stating the she is still having stomach issues and she has changed the diiet as suggested but still no improvement Would like a referral to a gastroenterology    Best number [phone]

## 2017-07-19 ENCOUNTER — Ambulatory Visit: Payer: BLUE CROSS/BLUE SHIELD | Admitting: Nurse Practitioner

## 2017-07-25 ENCOUNTER — Ambulatory Visit: Payer: BLUE CROSS/BLUE SHIELD | Admitting: Gastroenterology

## 2017-09-11 ENCOUNTER — Encounter: Payer: Self-pay | Admitting: Family Medicine

## 2017-09-11 NOTE — Telephone Encounter (Signed)
Please see her mychart note and see if we can get her scheduled with GI. Thanks.

## 2017-09-13 ENCOUNTER — Encounter: Payer: Self-pay | Admitting: Physician Assistant

## 2017-10-09 ENCOUNTER — Encounter: Payer: Self-pay | Admitting: Physician Assistant

## 2017-10-09 ENCOUNTER — Ambulatory Visit (INDEPENDENT_AMBULATORY_CARE_PROVIDER_SITE_OTHER): Payer: BLUE CROSS/BLUE SHIELD | Admitting: Physician Assistant

## 2017-10-09 VITALS — BP 126/80 | HR 70 | Ht 63.0 in | Wt 231.6 lb

## 2017-10-09 DIAGNOSIS — R197 Diarrhea, unspecified: Secondary | ICD-10-CM

## 2017-10-09 DIAGNOSIS — R112 Nausea with vomiting, unspecified: Secondary | ICD-10-CM | POA: Diagnosis not present

## 2017-10-09 DIAGNOSIS — R1013 Epigastric pain: Secondary | ICD-10-CM | POA: Diagnosis not present

## 2017-10-09 DIAGNOSIS — R1084 Generalized abdominal pain: Secondary | ICD-10-CM | POA: Diagnosis not present

## 2017-10-09 DIAGNOSIS — R6881 Early satiety: Secondary | ICD-10-CM

## 2017-10-09 MED ORDER — PANTOPRAZOLE SODIUM 40 MG PO TBEC: DELAYED_RELEASE_TABLET | ORAL | 6 refills | Status: DC

## 2017-10-09 MED ORDER — ONDANSETRON 4 MG PO TBDP: ORAL_TABLET | ORAL | 1 refills | Status: DC

## 2017-10-09 MED ORDER — DICYCLOMINE HCL 10 MG PO CAPS: ORAL_CAPSULE | ORAL | 6 refills | Status: DC

## 2017-10-09 NOTE — Progress Notes (Signed)
Subjective:    Patient ID: Ann Obrien, female    DOB: [birth year], 32 y.o.   MRN: [id number]  HPI " Ann Obrien" is a 32 year old white female, new to GI today referred by Dr. Billey Chang  Patient has not had any prior GI evaluation.  She is status post cholecystectomy in 2008. She says that her current symptoms have been present over the past year with intermittent episodes of nausea vomiting and diarrhea tend to last for at least 24 hours.  She says these episodes generally start with foul "sulfur" belching treated when she has that she knows an episode will follow with abdominal cramping diarrhea nausea and vomiting.  She says these are happening every 2 to 3 weeks.  In between these episode she is having difficulty eating and says that if she ate any significant amount she will feel sick and nauseated and will have loose stools.  Her appetite has been stable as has her weight.  She has not had any fever or chills.  She says she is been living on "snacks and crackers is any large amount seem to upset her stomach.  She is not aware of any specific foods that trigger the episodes of nausea vomiting and diarrhea. She does feel that she has some lactose intolerance and avoids ice cream and milk but is able to tolerate some cheeses. She has not been on any recent antibiotics, no new medications medicine dosages or supplements. Family history is negative for colon cancer polyps and IBD as far she is aware. She did have labs done in March 2019 which included TTG and IgA which were negative CBC C met and TSH all within normal limits and sed rate of 6.  Patient says she is miserable. Review of Systems Pertinent positive and negative review of systems were noted in the above HPI section.  All other review of systems was otherwise negative.  Outpatient Encounter Medications as of 10/09/2017  Medication Sig  . Aspirin-Caffeine (BC FAST PAIN RELIEF) 845-65 MG PACK Take by mouth as needed.  . fluticasone  (FLONASE) 50 MCG/ACT nasal spray Place into both nostrils daily.  . Loratadine (CLARITIN PO) Take by mouth.  . dicyclomine (BENTYL) 10 MG capsule Take 1 tablet by mouth twice daily for abdominal pain ; diarrhea.  . ondansetron (ZOFRAN ODT) 4 MG disintegrating tablet Dissolve 1 tablet on the tongue every 6 hours as needed for nausea.  . pantoprazole (PROTONIX) 40 MG tablet Take 1 tablet by mouth every morning.   No facility-administered encounter medications on file as of 10/09/2017.    Allergies  Allergen Reactions  . Zithromax [Azithromycin] Other (See Comments)    Numbness   . Oxycodone Anxiety    Nervousness   . Tylenol 8 Hour [Acetaminophen] Anxiety    Panic Attacks  Only Tylenol # 3   Patient Active Problem List   Diagnosis Date Noted  . Cigarette nicotine dependence 05/25/2017  . Chronic allergic rhinitis due to pollen 05/25/2017  . Migraine headache without aura 05/25/2017  . Contraceptive use - depo-provera 05/25/2017  . Hidradenitis suppurativa 05/25/2017   Social History   Socioeconomic History  . Marital status: Single    Spouse name: Not on file  . Number of children: 1  . Years of education: Not on file  . Highest education level: Not on file  Occupational History  . Occupation: Landscape architect, Clinical research associate  Social Needs  . Financial resource strain: Not on file  . Food insecurity:  Worry: Not on file    Inability: Not on file  . Transportation needs:    Medical: Not on file    Non-medical: Not on file  Tobacco Use  . Smoking status: Current Every Day Smoker    Packs/day: 1.00    Years: 20.00    Pack years: 20.00    Types: Cigarettes  . Smokeless tobacco: Never Used  Substance and Sexual Activity  . Alcohol use: Yes    Comment: social  . Drug use: No  . Sexual activity: Yes    Comment: Depo Provera  Lifestyle  . Physical activity:    Days per week: Not on file    Minutes per session: Not on file  . Stress: Not on file  Relationships  . Social  connections:    Talks on phone: Not on file    Gets together: Not on file    Attends religious service: Not on file    Active member of club or organization: Not on file    Attends meetings of clubs or organizations: Not on file    Relationship status: Not on file  . Intimate partner violence:    Fear of current or ex partner: Not on file    Emotionally abused: Not on file    Physically abused: Not on file    Forced sexual activity: Not on file  Other Topics Concern  . Not on file  Social History Narrative  . Not on file    Ms. Creed's family history includes Alcohol abuse in her maternal grandfather and paternal grandfather; Arthritis in her mother, paternal grandfather, and paternal grandmother; Asthma in her paternal grandmother and son; Birth defects in her paternal grandmother; COPD in her maternal grandmother and paternal grandfather; Cancer in her father and maternal grandmother; Diabetes in her maternal grandmother; Early death in her maternal grandfather, paternal grandfather, and paternal grandmother; Hearing loss in her maternal grandfather and paternal grandfather; Heart disease in her maternal grandfather and paternal grandfather; Hyperlipidemia in her maternal grandmother and mother; Hypertension in her father and maternal grandmother; Irritable bowel syndrome in her mother; Kidney disease in her maternal grandfather; Stroke in her maternal grandmother.      Objective:    Vitals:   10/09/17 0953  BP: 126/80  Pulse: 70    Physical Exam; well-developed white female in no acute distress, accompanied by her mother blood pressure 126/80 pulse 70 height 5 foot 3, weight 231, BMI 41.  HEENT; nontraumatic normocephalic EOMI PERRLA sclera anicteric, Oropharynx benign, Cardiovascular; regular rate and rhythm with S1-S2 no murmur rub gallop, Pulmonary; clear bilaterally, Abdomen; obese soft she is tender across the upper abdomen, there is no palpable mass or hepatosplenomegaly bowel  sounds are present, Rectal; exam not done, Extremities ;no clubbing cyanosis or edema skin warm dry, Neuro psych ;alert and oriented, grossly nonfocal mood and affect appropriate       Assessment & Plan:   #82 32 year old female with 1 year history of progressive issues with episodic nausea vomiting and diarrhea occurring every couple of weeks and lasting for at least 24 hours.  In between these episodes she is having ongoing early satiety type symptoms, loose stools and upper abdominal pain. Etiology of symptoms is not clear.  Rule out peptic ulcer disease, chronic gastropathy, component of gastroparesis.  Rule out underlying IBD. #2 s/p cholecystectomy #3 History of migraines  Plan; Start Protonix 40 mg p.o. every morning  Zofran 4 mg ODT every 6 hours to be used on a  as needed basis and taken immediately with onset of nausea Start Bentyl 10 mg p.o. twice daily Patient is scheduled for CT scan of the abdomen and pelvis with contrast Further plans pending results of above.  If CT is unrevealing I suspect she will need EGD and colonoscopy to be scheduled with Dr. Silverio Decamp .This was discussed with the patient today as well.  Ann S Esterwood PA-C 10/09/2017   Cc: Leamon Arnt, MD

## 2017-10-09 NOTE — Patient Instructions (Addendum)
  If you are age 33 or younger, your body mass index should be between 19-25. Your Body mass index is 41.03 kg/m. If this is out of the aformentioned range listed, please consider follow up with your Primary Care Provider.   We have sent the following medications to your pharmacy for you to pick up at your convenience: [facility], [place]. 1. Zofran ODT 4 mg 2. Pantoprazole Sodium 40 mg 3. Bentyl ( dicyclomine) 10 mg.   You have been scheduled for a CT scan of the abdomen and pelvis at [facility] ([address]---this is in the same building as Press [facility]).   You are scheduled on Wednesday 10-17-2017 at 9:00 am. You should arrive at 8:45 am prior to your appointment time for registration. Please follow the written instructions below on the day of your exam:  WARNING: IF YOU ARE ALLERGIC TO IODINE/X-RAY DYE, PLEASE NOTIFY RADIOLOGY IMMEDIATELY AT [phone]! YOU WILL BE GIVEN A 13 HOUR PREMEDICATION PREP.  1) Do not eat  anything after 5:00 am (4 hours prior to your test) Drink plenty of fluids.  2) You have been given 2 bottles of oral contrast to drink. The solution may taste better if refrigerated, but do NOT add ice or any other liquid to this solution. Shake well before drinking.    Drink 1 bottle of contrast @ 7:00 am (2 hours prior to your exam)  Drink 1 bottle of contrast @ 8:00 am (1 hour prior to your exam)  You may take any medications as prescribed with a small amount of water except for the following: Metformin, Glucophage, Glucovance, Avandamet, Riomet, Fortamet, Actoplus Met, Janumet, Glumetza or Metaglip. The above medications must be held the day of the exam AND 48 hours after the exam.  The purpose of you drinking the oral contrast is to aid in the visualization of your intestinal tract. The contrast solution may cause some diarrhea. Before your exam is started, you will be given a small amount of fluid to drink. Depending on your individual set of  symptoms, you may also receive an intravenous injection of x-ray contrast/dye. Plan on being at [facility] for 30 minutes or long, depending on the type of exam you are having performed.  If you have any questions regarding your exam or if you need to reschedule, you may call the CT department at [phone] between the hours of 8:00 am and 5:00 pm, Monday-Friday.  ________________________________________________________________________

## 2017-10-13 ENCOUNTER — Encounter: Payer: Self-pay | Admitting: Physician Assistant

## 2017-10-15 ENCOUNTER — Telehealth: Payer: Self-pay | Admitting: *Deleted

## 2017-10-15 NOTE — Telephone Encounter (Signed)
LM for the patient to please call me. I asked on my message if she ever tried any other PPI or Xantac or Pepcid.  There is nothing else on her historical medication list other than the Pantoprazole sodium 40 mg Amy Esterwood PA prescribed.

## 2017-10-17 ENCOUNTER — Ambulatory Visit (INDEPENDENT_AMBULATORY_CARE_PROVIDER_SITE_OTHER)
Admission: RE | Admit: 2017-10-17 | Discharge: 2017-10-17 | Disposition: A | Discharge status: Home / Self Care | Payer: BLUE CROSS/BLUE SHIELD | Source: Ambulatory Visit | Attending: Physician Assistant | Admitting: Physician Assistant

## 2017-10-17 DIAGNOSIS — R6881 Early satiety: Secondary | ICD-10-CM

## 2017-10-17 DIAGNOSIS — R1013 Epigastric pain: Secondary | ICD-10-CM

## 2017-10-17 DIAGNOSIS — R197 Diarrhea, unspecified: Secondary | ICD-10-CM

## 2017-10-17 DIAGNOSIS — R1084 Generalized abdominal pain: Secondary | ICD-10-CM | POA: Diagnosis not present

## 2017-10-17 DIAGNOSIS — R112 Nausea with vomiting, unspecified: Secondary | ICD-10-CM

## 2017-10-17 MED ORDER — IOPAMIDOL (ISOVUE-300) INJECTION 61%
100.0000 mL | Freq: Once | INTRAVENOUS | Status: AC | PRN
  Administered 2017-10-17: 100 mL via INTRAVENOUS

## 2017-10-18 ENCOUNTER — Encounter: Payer: Self-pay | Admitting: Physician Assistant

## 2017-10-24 NOTE — Progress Notes (Signed)
Reviewed and agree with documentation and assessment and plan. K. Veena Arian Murley , MD   

## 2019-05-06 ENCOUNTER — Other Ambulatory Visit: Payer: Self-pay | Admitting: Nurse Practitioner

## 2019-05-06 DIAGNOSIS — D279 Benign neoplasm of unspecified ovary: Secondary | ICD-10-CM

## 2019-05-06 DIAGNOSIS — R102 Pelvic and perineal pain: Secondary | ICD-10-CM

## 2019-05-12 ENCOUNTER — Ambulatory Visit
Admission: RE | Admit: 2019-05-12 | Discharge: 2019-05-12 | Disposition: A | Discharge status: Home / Self Care | Payer: BLUE CROSS/BLUE SHIELD | Source: Ambulatory Visit | Attending: Nurse Practitioner | Admitting: Nurse Practitioner

## 2019-05-12 DIAGNOSIS — D279 Benign neoplasm of unspecified ovary: Secondary | ICD-10-CM

## 2019-05-12 DIAGNOSIS — R102 Pelvic and perineal pain: Secondary | ICD-10-CM

## 2019-07-13 IMAGING — CT CT ABD-PELV W/ CM
2 of 4 series · 17 of 46 positions shown, 19 images · IV contrast (iopamidol)
Comparison: None.

CLINICAL DATA: Abdominal pain, nausea and vomiting, and diarrhea
for 1 year. Early satiety.

EXAM:
CT ABDOMEN AND PELVIS WITH CONTRAST
TECHNIQUE: Multidetector CT imaging of the abdomen and pelvis was performed
using the standard protocol following bolus administration of
intravenous contrast.
CONTRAST:  100mL [id number] IOPAMIDOL (VIGZZ7-1XX) INJECTION 61%

[Series 2: abd/pel w · axial · 0.98mm/px · z∈[-801,-341]mm · 14 of 102 slices shown, 16 images]
[im 5/102  soft-tissue]
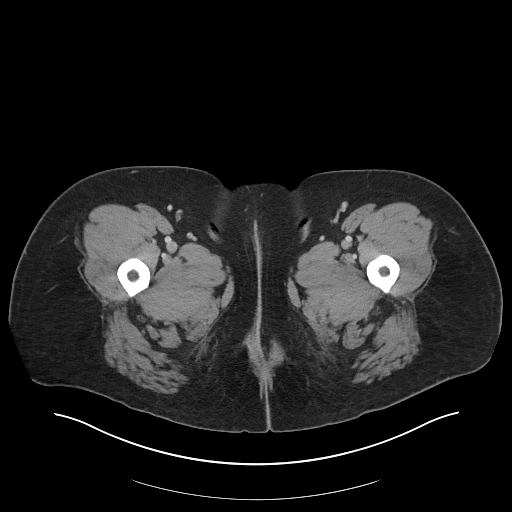
[im 5/102  bone]
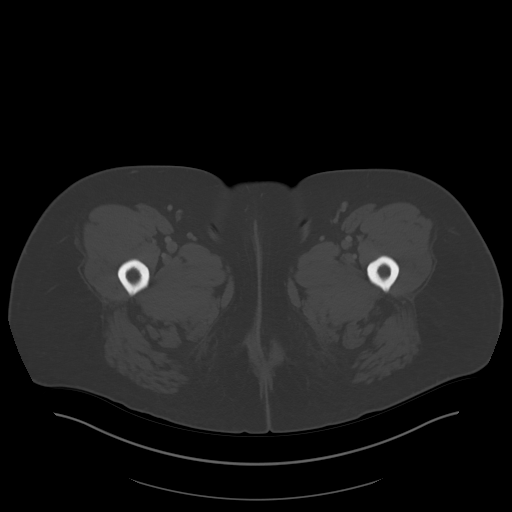
[im 14/102  soft-tissue]
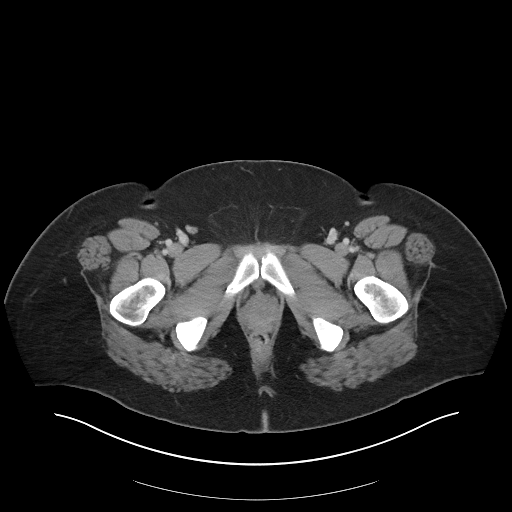
[im 18/102  soft-tissue]
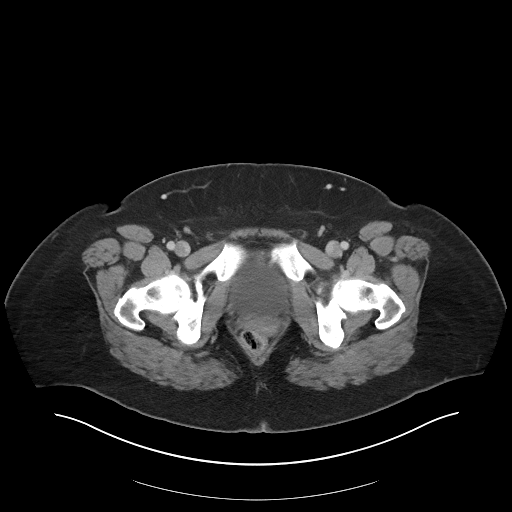
[im 27/102  soft-tissue]
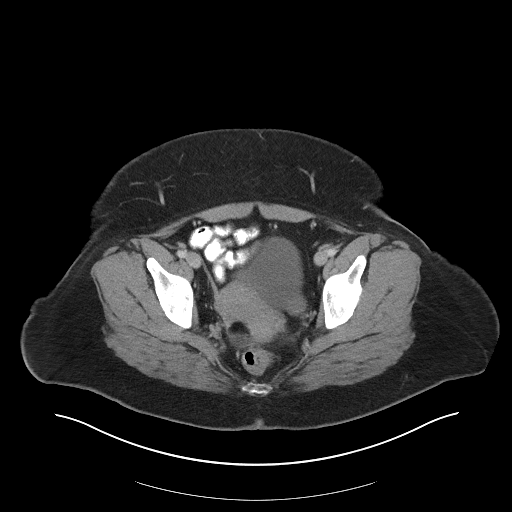
[im 36/102  soft-tissue]
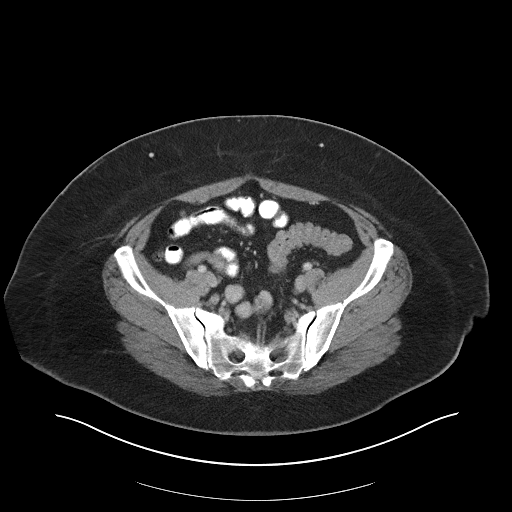
[im 40/102  soft-tissue]
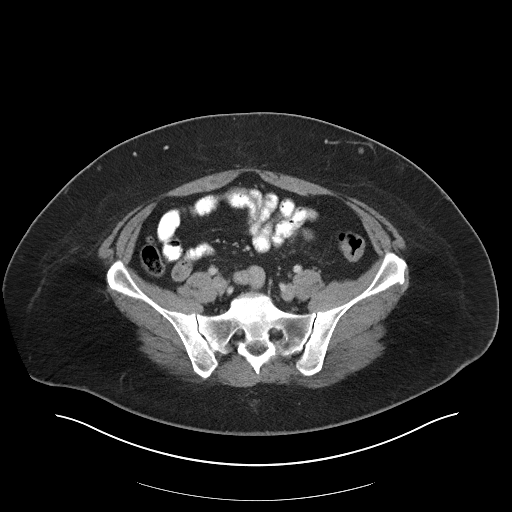
[im 49/102  soft-tissue]
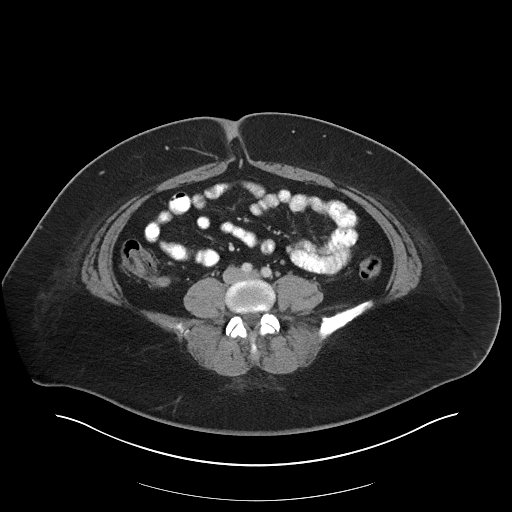
[im 53/102  soft-tissue]
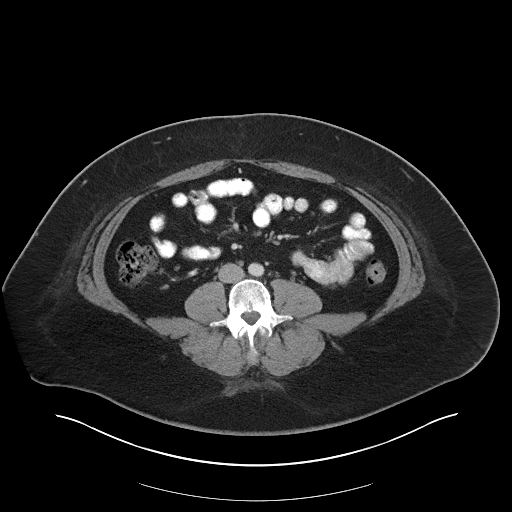
[im 62/102  soft-tissue]
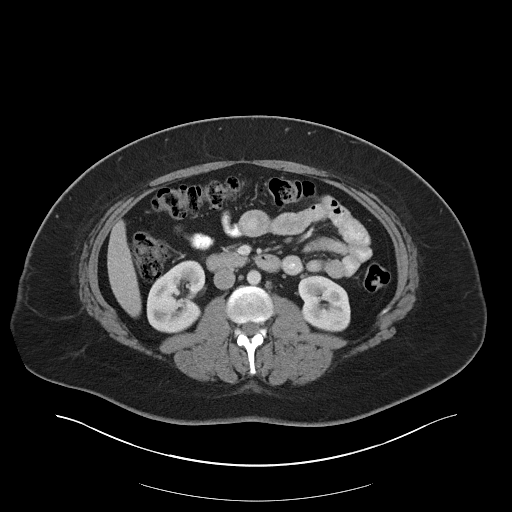
[im 62/102  bone]
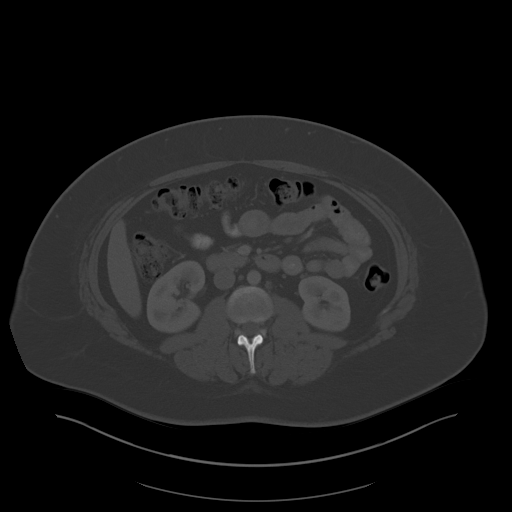
[im 66/102  soft-tissue]
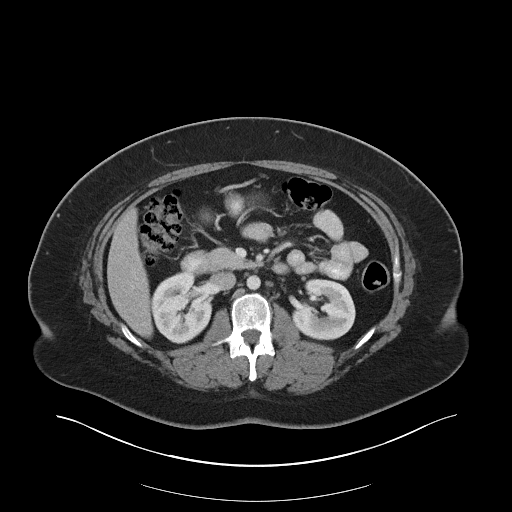
[im 75/102  soft-tissue]
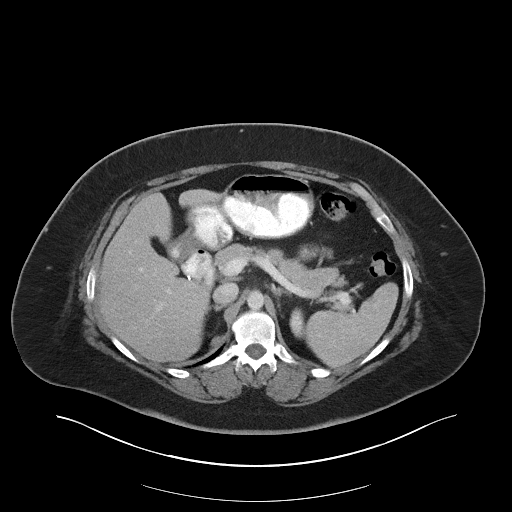
[im 84/102  soft-tissue]
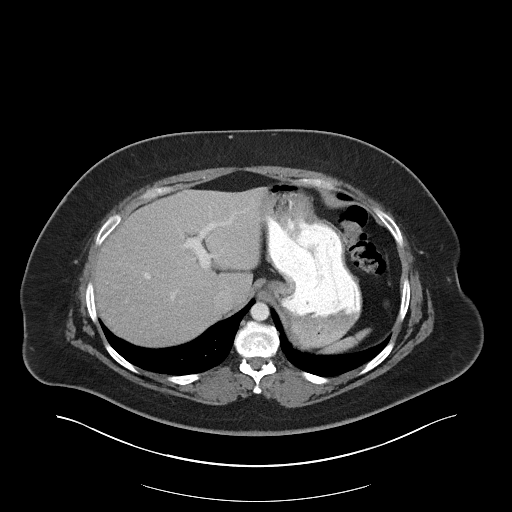
[im 88/102  soft-tissue]
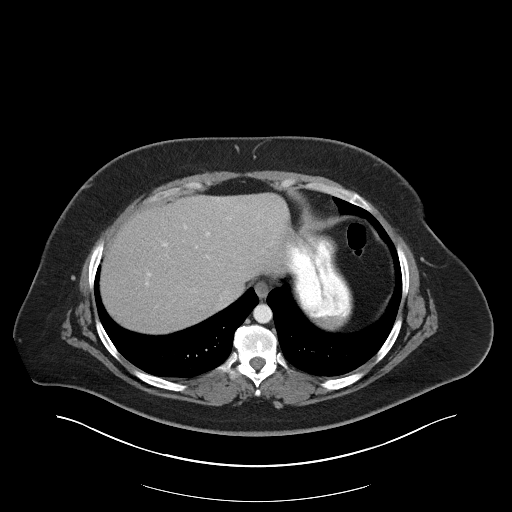
[im 97/102  soft-tissue]
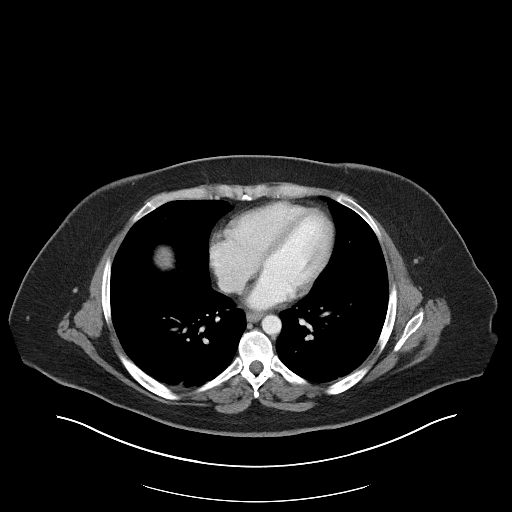

[Series 5: abd/pel w st · coronal · 0.93mm/px · 3 of 108 slices shown]
[im 36/108  soft-tissue]
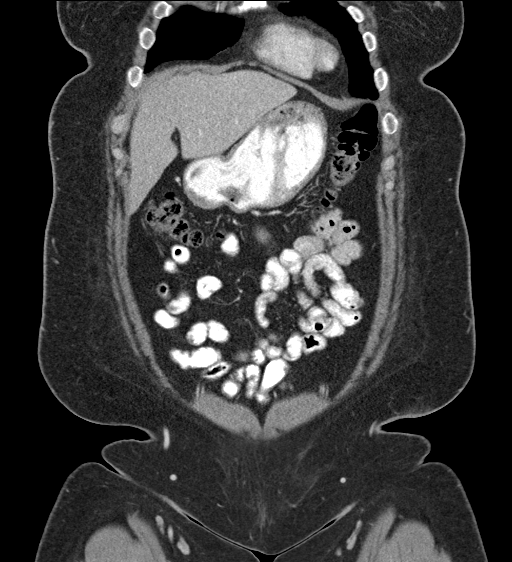
[im 48/108  soft-tissue]
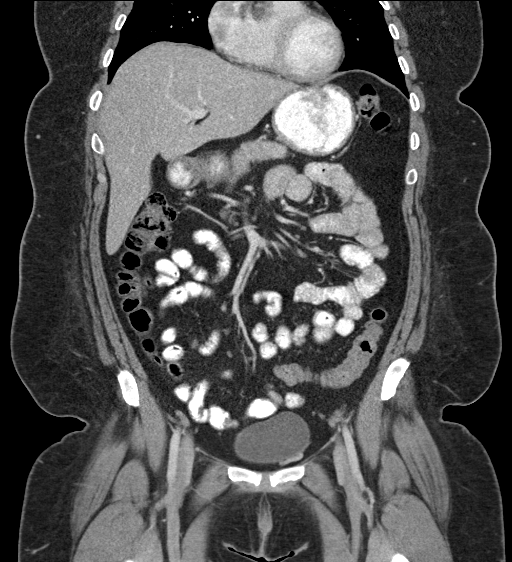
[im 60/108  soft-tissue]
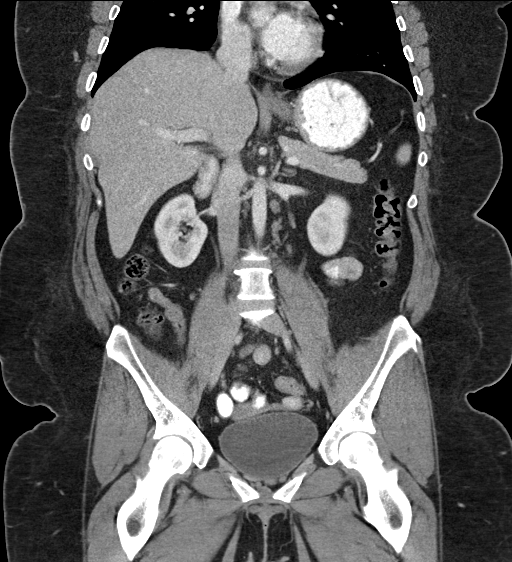

[17 of 46 positions shown; findings below may reference images not displayed]

FINDINGS: Lower Chest: No acute findings.

Hepatobiliary: No hepatic masses identified. Prior cholecystectomy.
No evidence of biliary obstruction.

Pancreas:  No mass or inflammatory changes.

Spleen: Within normal limits in size and appearance.

Adrenals/Urinary Tract: No masses identified. No evidence of
hydronephrosis.

Stomach/Bowel: No evidence of obstruction, inflammatory process or
abnormal fluid collections. Normal appendix visualized.

Vascular/Lymphatic: No pathologically enlarged lymph nodes. No
abdominal aortic aneurysm.

Reproductive: Uterus is unremarkable. A 4.4 cm right adnexal mass is
seen which is largely composed of fat, consistent with a benign
ovarian dermoid. No other masses, inflammatory process, or abnormal
fluid collections seen.

Other:  None.

Musculoskeletal:  No suspicious bone lesions identified.
IMPRESSION: No acute findings.

4.4 cm benign right ovarian dermoid.

## 2019-11-13 ENCOUNTER — Other Ambulatory Visit: Payer: BC Managed Care – PPO

## 2019-11-13 ENCOUNTER — Other Ambulatory Visit: Payer: Self-pay

## 2019-11-13 DIAGNOSIS — Z20822 Contact with and (suspected) exposure to covid-19: Secondary | ICD-10-CM

## 2019-11-15 LAB — NOVEL CORONAVIRUS, NAA: SARS-CoV-2, NAA: NOT DETECTED

## 2019-11-15 LAB — SARS-COV-2, NAA 2 DAY TAT

## 2020-03-15 ENCOUNTER — Ambulatory Visit: Payer: BC Managed Care – PPO | Admitting: Medical-Surgical

## 2020-03-22 ENCOUNTER — Ambulatory Visit: Payer: BC Managed Care – PPO | Admitting: Medical-Surgical

## 2020-03-30 ENCOUNTER — Ambulatory Visit (INDEPENDENT_AMBULATORY_CARE_PROVIDER_SITE_OTHER): Payer: BC Managed Care – PPO | Admitting: Medical-Surgical

## 2020-03-30 ENCOUNTER — Other Ambulatory Visit: Payer: Self-pay

## 2020-03-30 ENCOUNTER — Encounter: Payer: Self-pay | Admitting: Medical-Surgical

## 2020-03-30 VITALS — BP 118/87 | HR 111 | Temp 98.9°F | Ht 62.5 in | Wt 253.2 lb

## 2020-03-30 DIAGNOSIS — M545 Low back pain, unspecified: Secondary | ICD-10-CM

## 2020-03-30 DIAGNOSIS — F32A Depression, unspecified: Secondary | ICD-10-CM | POA: Insufficient documentation

## 2020-03-30 DIAGNOSIS — Z7689 Persons encountering health services in other specified circumstances: Secondary | ICD-10-CM

## 2020-03-30 MED ORDER — CYCLOBENZAPRINE HCL 10 MG PO TABS: 10.0000 mg | ORAL_TABLET | Freq: Three times a day (TID) | ORAL | 0 refills | Status: AC | PRN

## 2020-03-30 NOTE — Progress Notes (Signed)
New Patient Office Visit  Subjective:  Patient ID: Ann Obrien, female    DOB: [birth year]  Age: 35 y.o. MRN: [id number]  CC:  Chief Complaint  Patient presents with  . Establish Care  . Back Pain    HPI [place] presents to establish care.  She is an employee at [employer] and notes that she had recently had a back injury.  Her injury was on 11/6 and she has been evaluated by the provider for their [place].  They provided her with 2 prescriptions, 1 for tizanidine and the other for ketorolac.  They gave her 10-day supply of medication at that time.  She found no relief with either of those medications.  She is currently still working regular hours but is on light duty.  They have assigned her the task of walking around with a laptop on a cart but the cart is very low and she has to walk around bent over in order to use it correctly.  She has not been lifting anything at work since her injury.  Having significant pain in the right lumbar area which is interfering with her ability to sleep.  She is now taking over-the-counter Aleve or Bayer back and body which is more helpful than the prescribed medications that she took.  She was prescribed to have a pain patch as well as physical therapy but her Worker's Comp. representative has denied coverage of both of those.  She does work for shift Wednesday through Saturday every week but also has to do occasional mandatory overtime.  Denies numbness, weakness, and tingling of the lower extremities.  No saddle paresthesias or new onset incontinence of bowel/bladder.   Past Medical History:  Diagnosis Date  . Allergy   . Axillary hidradenitis suppurativa   . Depression   . Frequent headaches   . GERD (gastroesophageal reflux disease)   . HPV in female   . Migraines    Past Surgical History:  Procedure Laterality Date  . CHOLECYSTECTOMY  2008  . COLPOSCOPY      Family History  Problem Relation Age of Onset  . Asthma Son    . Irritable bowel syndrome Mother   . Arthritis Mother   . Hyperlipidemia Mother   . Cancer Father   . Hypertension Father   . Cancer Maternal Grandmother   . COPD Maternal Grandmother   . Diabetes Maternal Grandmother   . Hypertension Maternal Grandmother   . Hyperlipidemia Maternal Grandmother   . Stroke Maternal Grandmother   . Hearing loss Maternal Grandfather   . Heart disease Maternal Grandfather   . Kidney disease Maternal Grandfather   . Alcohol abuse Maternal Grandfather   . Early death Maternal Grandfather   . Birth defects Paternal Grandmother   . Asthma Paternal Grandmother   . Arthritis Paternal Grandmother   . Early death Paternal Grandmother   . COPD Paternal Grandfather   . Arthritis Paternal Grandfather   . Alcohol abuse Paternal Grandfather   . Early death Paternal Grandfather   . Hearing loss Paternal Grandfather   . Heart disease Paternal Grandfather     Social History   Socioeconomic History  . Marital status: Single    Spouse name: Not on file  . Number of children: 1  . Years of education: Not on file  . Highest education level: Not on file  Occupational History  . Occupation: Landscape architect, Clinical research associate  Tobacco Use  . Smoking status: Current Every Day Smoker  Packs/day: 1.00    Years: 20.00    Pack years: 20.00    Types: Cigarettes  . Smokeless tobacco: Never Used  Vaping Use  . Vaping Use: Every day  Substance and Sexual Activity  . Alcohol use: Yes    Comment: occasionally  . Drug use: No  . Sexual activity: Yes    Birth control/protection: Injection  Other Topics Concern  . Not on file  Social History Narrative  . Not on file   Social Determinants of Health   Financial Resource Strain: Not on file  Food Insecurity: Not on file  Transportation Needs: Not on file  Physical Activity: Not on file  Stress: Not on file  Social Connections: Not on file  Intimate Partner Violence: Not on file    ROS Review of Systems   Constitutional: Negative for chills, fatigue, fever and unexpected weight change.  Respiratory: Negative for cough, shortness of breath and wheezing.   Cardiovascular: Negative for chest pain, palpitations and leg swelling.  Genitourinary: Negative for difficulty urinating.  Musculoskeletal: Positive for back pain.  Neurological: Negative for dizziness, light-headedness and headaches.  Psychiatric/Behavioral: Positive for sleep disturbance. Negative for dysphoric mood, self-injury and suicidal ideas. The patient is not nervous/anxious.     Objective:   Today's Vitals: BP 118/87   Pulse (!) 111   Temp 98.9 F (37.2 C)   Ht 5' 2.5" (1.588 m)   Wt 253 lb 3.2 oz (114.9 kg)   SpO2 98%   BMI 45.57 kg/m   Physical Exam Vitals reviewed.  Constitutional:      General: She is not in acute distress.    Appearance: Normal appearance.  HENT:     Head: Normocephalic and atraumatic.  Cardiovascular:     Rate and Rhythm: Normal rate and regular rhythm.     Pulses: Normal pulses.     Heart sounds: Normal heart sounds. No murmur heard. No friction rub. No gallop.   Pulmonary:     Effort: Pulmonary effort is normal. No respiratory distress.     Breath sounds: Normal breath sounds. No wheezing.  Musculoskeletal:     Lumbar back: Tenderness present.       Back:     Comments: Negative straight leg raise test.  Mildly positive Corky Sox on the right with negative Corky Sox on the left.  Skin:    General: Skin is warm and dry.  Neurological:     Mental Status: She is alert and oriented to person, place, and time.  Psychiatric:        Mood and Affect: Mood normal.        Behavior: Behavior normal.        Thought Content: Thought content normal.        Judgment: Judgment normal.     Assessment & Plan:   1. Encounter to establish care Reviewed available information and discussed healthcare concerns with patient.  2. Acute right-sided low back pain without sciatica Okay to continue OTC pain  relievers.  Recommend over-the-counter pain patches if she would like to try those.  Referring to physical therapy as this is likely to be the most helpful for her current pain.  Flexeril 10 mg 3 times daily as needed, advised to utilize this at bedtime to help sleep at night and avoid using while working due to its tendency to cause drowsiness.  Recommend utilizing a taller cart while working to prevent poor posture and bending for long hours.  Okay to use heat/ice as tolerated. -  Ambulatory referral to Physical Therapy   Outpatient Encounter Medications as of 03/30/2020  Medication Sig  . Aspirin-Caffeine 845-65 MG PACK Take by mouth as needed.  . cyclobenzaprine (FLEXERIL) 10 MG tablet Take 1 tablet (10 mg total) by mouth 3 (three) times daily as needed for muscle spasms.  . fluticasone (FLONASE) 50 MCG/ACT nasal spray Place into both nostrils daily.  . Loratadine (CLARITIN PO) Take by mouth.  . naproxen sodium (ALEVE) 220 MG tablet Take 220 mg by mouth daily as needed.  Marland Kitchen omeprazole (PRILOSEC OTC) 20 MG tablet Take 20 mg by mouth daily.  . [DISCONTINUED] dicyclomine (BENTYL) 10 MG capsule Take 1 tablet by mouth twice daily for abdominal pain ; diarrhea.  . [DISCONTINUED] ondansetron (ZOFRAN ODT) 4 MG disintegrating tablet Dissolve 1 tablet on the tongue every 6 hours as needed for nausea.  . [DISCONTINUED] pantoprazole (PROTONIX) 40 MG tablet Take 1 tablet by mouth every morning.   No facility-administered encounter medications on file as of 03/30/2020.   Follow-up: Return in about 6 weeks (around 05/11/2020) for back pain follow up.   Clearnce Sorrel, DNP, APRN, FNP-BC [facility]

## 2020-04-03 ENCOUNTER — Other Ambulatory Visit: Payer: BC Managed Care – PPO

## 2020-04-03 DIAGNOSIS — Z20822 Contact with and (suspected) exposure to covid-19: Secondary | ICD-10-CM

## 2020-04-06 LAB — NOVEL CORONAVIRUS, NAA: SARS-CoV-2, NAA: DETECTED — AB

## 2020-04-15 NOTE — Progress Notes (Signed)
Virtual Visit via Video Note  I connected with Ann Obrien on 04/16/20 at  9:40 AM EST by a video enabled telemedicine application and verified that I am speaking with the correct person using two identifiers.   I discussed the limitations of evaluation and management by telemedicine and the availability of in person appointments. The patient expressed understanding and agreed to proceed.  Patient location: home Provider locations: office  Subjective:    CC: covid 19 work clearance  HPI: Ann 35 year old Obrien presenting via [place] video visit for clearance to return to work after having a COVID positive test. She has been out of work since 04/02/2020 and was supposed to return on 04/09/2020 but her mother was having fevers and she extended her time out. She was instructed to return to work on 04/14/2020 and did so. Unfortunately she was unable to clock in and still listed as inactive. When she called HR, she was told that she needed to speak with her doctor about a clearance to return to work. Has paperwork to email to [place] for filling out related to her work absence for illness.   She had mild symptoms with COVID and has a lingering mild sore throat and cough. Feels that her sinus congestion is about the same as the yearly sinus infection she gets and is treating this with OTC sinus medications. Not interested in an antibiotic at this time.   Past medical history, Surgical history, Family history not pertinant except as noted below, Social history, Allergies, and medications have been entered into the medical record, reviewed, and corrections made.   Review of Systems: See HPI for pertinent positives and negatives.   Objective:    General: Speaking clearly in complete sentences without any shortness of breath.  Alert and oriented x3.  Normal judgment. No apparent acute distress.  Impression and Recommendations:    1. COVID-19 She has completed her quarantine and is on the mend.  Ok to return to work. Letter sent to her via MyChart for return on 04/17/2020. Patient contacted to provide email for her to send the paperwork for completion.   I discussed the assessment and treatment plan with the patient. The patient was provided an opportunity to ask questions and all were answered. The patient agreed with the plan and demonstrated an understanding of the instructions.   The patient was advised to call back or seek an in-person evaluation if the symptoms worsen or if the condition fails to improve as anticipated.  20 minutes of non-face-to-face time was provided during this encounter.  Return if symptoms worsen or fail to improve.  Clearnce Sorrel, DNP, APRN, FNP-BC [facility]

## 2020-04-16 ENCOUNTER — Telehealth (INDEPENDENT_AMBULATORY_CARE_PROVIDER_SITE_OTHER): Payer: BC Managed Care – PPO | Admitting: Medical-Surgical

## 2020-04-16 ENCOUNTER — Encounter: Payer: Self-pay | Admitting: Medical-Surgical

## 2020-04-16 DIAGNOSIS — U071 COVID-19: Secondary | ICD-10-CM | POA: Diagnosis not present

## 2020-04-19 ENCOUNTER — Ambulatory Visit: Payer: BC Managed Care – PPO | Admitting: Physical Therapy

## 2020-05-11 ENCOUNTER — Ambulatory Visit: Payer: BC Managed Care – PPO | Admitting: Medical-Surgical

## 2020-08-23 ENCOUNTER — Telehealth: Payer: Self-pay | Admitting: General Practice

## 2020-08-23 NOTE — Telephone Encounter (Signed)
Transition Care Management Unsuccessful Follow-up Telephone Call  Date of discharge and from where:  [facility] 08/19/20  Attempts:  1st Attempt  Reason for unsuccessful TCM follow-up call:  Left voice message

## 2020-08-24 NOTE — Telephone Encounter (Signed)
Transition Care Management Unsuccessful Follow-up Telephone Call  Date of discharge and from where:  08/19/2020 from [facility]  Attempts:  2nd Attempt  Reason for unsuccessful TCM follow-up call:  Left voice message

## 2020-08-25 NOTE — Telephone Encounter (Signed)
Transition Care Management Unsuccessful Follow-up Telephone Call  Date of discharge and from where:  08/19/20 from [facility]   Attempts:  3rd Attempt  Reason for unsuccessful TCM follow-up call:  Left voice message

## 2020-10-20 DIAGNOSIS — Z3042 Encounter for surveillance of injectable contraceptive: Secondary | ICD-10-CM | POA: Diagnosis not present

## 2021-01-12 DIAGNOSIS — Z3042 Encounter for surveillance of injectable contraceptive: Secondary | ICD-10-CM | POA: Diagnosis not present

## 2021-04-06 DIAGNOSIS — Z30017 Encounter for initial prescription of implantable subdermal contraceptive: Secondary | ICD-10-CM | POA: Diagnosis not present

## 2021-04-06 DIAGNOSIS — R635 Abnormal weight gain: Secondary | ICD-10-CM | POA: Diagnosis not present

## 2021-04-06 DIAGNOSIS — R232 Flushing: Secondary | ICD-10-CM | POA: Diagnosis not present

## 2021-04-06 DIAGNOSIS — Z3009 Encounter for other general counseling and advice on contraception: Secondary | ICD-10-CM | POA: Diagnosis not present

## 2021-04-20 DIAGNOSIS — Z419 Encounter for procedure for purposes other than remedying health state, unspecified: Secondary | ICD-10-CM | POA: Diagnosis not present

## 2021-05-18 DIAGNOSIS — Z419 Encounter for procedure for purposes other than remedying health state, unspecified: Secondary | ICD-10-CM | POA: Diagnosis not present

## 2021-06-18 DIAGNOSIS — Z419 Encounter for procedure for purposes other than remedying health state, unspecified: Secondary | ICD-10-CM | POA: Diagnosis not present

## 2021-07-18 DIAGNOSIS — Z419 Encounter for procedure for purposes other than remedying health state, unspecified: Secondary | ICD-10-CM | POA: Diagnosis not present

## 2021-07-25 DIAGNOSIS — Z01419 Encounter for gynecological examination (general) (routine) without abnormal findings: Secondary | ICD-10-CM | POA: Diagnosis not present

## 2021-08-18 DIAGNOSIS — Z419 Encounter for procedure for purposes other than remedying health state, unspecified: Secondary | ICD-10-CM | POA: Diagnosis not present

## 2021-09-17 DIAGNOSIS — Z419 Encounter for procedure for purposes other than remedying health state, unspecified: Secondary | ICD-10-CM | POA: Diagnosis not present

## 2021-10-18 DIAGNOSIS — Z419 Encounter for procedure for purposes other than remedying health state, unspecified: Secondary | ICD-10-CM | POA: Diagnosis not present

## 2021-11-18 DIAGNOSIS — Z419 Encounter for procedure for purposes other than remedying health state, unspecified: Secondary | ICD-10-CM | POA: Diagnosis not present

## 2021-12-18 DIAGNOSIS — Z419 Encounter for procedure for purposes other than remedying health state, unspecified: Secondary | ICD-10-CM | POA: Diagnosis not present

## 2022-01-18 DIAGNOSIS — Z419 Encounter for procedure for purposes other than remedying health state, unspecified: Secondary | ICD-10-CM | POA: Diagnosis not present

## 2022-02-17 DIAGNOSIS — Z419 Encounter for procedure for purposes other than remedying health state, unspecified: Secondary | ICD-10-CM | POA: Diagnosis not present

## 2022-03-20 DIAGNOSIS — Z419 Encounter for procedure for purposes other than remedying health state, unspecified: Secondary | ICD-10-CM | POA: Diagnosis not present

## 2022-04-20 DIAGNOSIS — Z419 Encounter for procedure for purposes other than remedying health state, unspecified: Secondary | ICD-10-CM | POA: Diagnosis not present

## 2022-05-19 DIAGNOSIS — Z419 Encounter for procedure for purposes other than remedying health state, unspecified: Secondary | ICD-10-CM | POA: Diagnosis not present

## 2022-06-19 DIAGNOSIS — Z419 Encounter for procedure for purposes other than remedying health state, unspecified: Secondary | ICD-10-CM | POA: Diagnosis not present

## 2022-07-19 DIAGNOSIS — Z419 Encounter for procedure for purposes other than remedying health state, unspecified: Secondary | ICD-10-CM | POA: Diagnosis not present

## 2022-08-19 DIAGNOSIS — Z419 Encounter for procedure for purposes other than remedying health state, unspecified: Secondary | ICD-10-CM | POA: Diagnosis not present

## 2022-09-18 DIAGNOSIS — Z419 Encounter for procedure for purposes other than remedying health state, unspecified: Secondary | ICD-10-CM | POA: Diagnosis not present

## 2022-10-19 DIAGNOSIS — Z419 Encounter for procedure for purposes other than remedying health state, unspecified: Secondary | ICD-10-CM | POA: Diagnosis not present

## 2022-11-19 DIAGNOSIS — Z419 Encounter for procedure for purposes other than remedying health state, unspecified: Secondary | ICD-10-CM | POA: Diagnosis not present

## 2022-12-19 DIAGNOSIS — Z419 Encounter for procedure for purposes other than remedying health state, unspecified: Secondary | ICD-10-CM | POA: Diagnosis not present

## 2023-01-19 DIAGNOSIS — Z419 Encounter for procedure for purposes other than remedying health state, unspecified: Secondary | ICD-10-CM | POA: Diagnosis not present

## 2023-02-18 DIAGNOSIS — Z419 Encounter for procedure for purposes other than remedying health state, unspecified: Secondary | ICD-10-CM | POA: Diagnosis not present

## 2023-03-21 DIAGNOSIS — Z419 Encounter for procedure for purposes other than remedying health state, unspecified: Secondary | ICD-10-CM | POA: Diagnosis not present

## 2023-04-01 DIAGNOSIS — Z419 Encounter for procedure for purposes other than remedying health state, unspecified: Secondary | ICD-10-CM | POA: Diagnosis not present

## 2023-04-21 DIAGNOSIS — Z419 Encounter for procedure for purposes other than remedying health state, unspecified: Secondary | ICD-10-CM | POA: Diagnosis not present

## 2023-05-19 DIAGNOSIS — Z419 Encounter for procedure for purposes other than remedying health state, unspecified: Secondary | ICD-10-CM | POA: Diagnosis not present

## 2023-06-30 DIAGNOSIS — Z419 Encounter for procedure for purposes other than remedying health state, unspecified: Secondary | ICD-10-CM | POA: Diagnosis not present

## 2023-07-30 DIAGNOSIS — Z419 Encounter for procedure for purposes other than remedying health state, unspecified: Secondary | ICD-10-CM | POA: Diagnosis not present

## 2023-08-30 DIAGNOSIS — Z419 Encounter for procedure for purposes other than remedying health state, unspecified: Secondary | ICD-10-CM | POA: Diagnosis not present

## 2023-09-29 DIAGNOSIS — Z419 Encounter for procedure for purposes other than remedying health state, unspecified: Secondary | ICD-10-CM | POA: Diagnosis not present

## 2023-10-30 DIAGNOSIS — Z419 Encounter for procedure for purposes other than remedying health state, unspecified: Secondary | ICD-10-CM | POA: Diagnosis not present

## 2023-11-30 DIAGNOSIS — Z419 Encounter for procedure for purposes other than remedying health state, unspecified: Secondary | ICD-10-CM | POA: Diagnosis not present
